# Patient Record
Sex: Female | Born: 1935 | Race: Black or African American | Hispanic: No | Marital: Single | Smoking: Current every day smoker
Health system: Southern US, Community
[De-identification: ages and names within clinical notes are randomized; demographics above are authoritative.]

## PROBLEM LIST (undated history)

## (undated) DIAGNOSIS — M5430 Sciatica, unspecified side: Secondary | ICD-10-CM

## (undated) DIAGNOSIS — M1990 Unspecified osteoarthritis, unspecified site: Secondary | ICD-10-CM

## (undated) DIAGNOSIS — I499 Cardiac arrhythmia, unspecified: Secondary | ICD-10-CM

## (undated) DIAGNOSIS — J40 Bronchitis, not specified as acute or chronic: Secondary | ICD-10-CM

## (undated) DIAGNOSIS — H9190 Unspecified hearing loss, unspecified ear: Secondary | ICD-10-CM

## (undated) DIAGNOSIS — F419 Anxiety disorder, unspecified: Secondary | ICD-10-CM

## (undated) DIAGNOSIS — R42 Dizziness and giddiness: Secondary | ICD-10-CM

## (undated) DIAGNOSIS — F32A Depression, unspecified: Secondary | ICD-10-CM

## (undated) DIAGNOSIS — J449 Chronic obstructive pulmonary disease, unspecified: Secondary | ICD-10-CM

## (undated) DIAGNOSIS — I10 Essential (primary) hypertension: Secondary | ICD-10-CM

## (undated) DIAGNOSIS — E785 Hyperlipidemia, unspecified: Secondary | ICD-10-CM

## (undated) DIAGNOSIS — F329 Major depressive disorder, single episode, unspecified: Secondary | ICD-10-CM

## (undated) DIAGNOSIS — J3089 Other allergic rhinitis: Secondary | ICD-10-CM

## (undated) HISTORY — DX: Dizziness and giddiness: R42

## (undated) HISTORY — PX: COLONOSCOPY: SHX174

## (undated) HISTORY — DX: Major depressive disorder, single episode, unspecified: F32.9

## (undated) HISTORY — DX: Anxiety disorder, unspecified: F41.9

## (undated) HISTORY — PX: NO PAST SURGERIES: SHX2092

## (undated) HISTORY — DX: Hyperlipidemia, unspecified: E78.5

## (undated) HISTORY — DX: Depression, unspecified: F32.A

---

## 2005-04-06 ENCOUNTER — Emergency Department: Payer: Self-pay | Admitting: Emergency Medicine

## 2005-04-10 ENCOUNTER — Ambulatory Visit: Payer: Self-pay | Admitting: Family Medicine

## 2005-05-02 ENCOUNTER — Encounter: Payer: Self-pay | Admitting: Unknown Physician Specialty

## 2005-05-26 ENCOUNTER — Encounter: Payer: Self-pay | Admitting: Unknown Physician Specialty

## 2007-05-01 ENCOUNTER — Ambulatory Visit: Payer: Self-pay

## 2007-05-07 ENCOUNTER — Ambulatory Visit: Payer: Self-pay

## 2009-04-23 ENCOUNTER — Ambulatory Visit: Payer: Self-pay | Admitting: Family Medicine

## 2010-05-02 ENCOUNTER — Emergency Department: Payer: Self-pay | Admitting: Emergency Medicine

## 2010-05-09 ENCOUNTER — Emergency Department: Payer: Self-pay | Admitting: Emergency Medicine

## 2012-12-29 ENCOUNTER — Emergency Department: Payer: Self-pay | Admitting: Emergency Medicine

## 2014-11-05 ENCOUNTER — Ambulatory Visit: Payer: Self-pay | Admitting: Internal Medicine

## 2014-11-23 ENCOUNTER — Ambulatory Visit: Payer: Self-pay | Admitting: Internal Medicine

## 2015-04-28 ENCOUNTER — Other Ambulatory Visit: Payer: Self-pay | Admitting: Internal Medicine

## 2015-04-28 DIAGNOSIS — N6489 Other specified disorders of breast: Secondary | ICD-10-CM

## 2015-05-19 ENCOUNTER — Other Ambulatory Visit: Payer: Self-pay

## 2015-10-21 IMAGING — US US BREAST*L* LIMITED INC AXILLA
1 series · 4 of 4 positions shown · non-contrast
Comparison: [DATE] [DATE], [DATE], [DATE] [DATE], [DATE]

CLINICAL DATA: Callback from screening mammogram for possible
masses left breast

EXAM:
DIGITAL DIAGNOSTIC LEFT MAMMOGRAM WITH CAD
DIGITAL BREAST TOMOSYNTHESIS
Digital breast tomosynthesis images are acquired in two projections.
These images are reviewed in combination with the digital mammogram,
confirming the findings below.
ULTRASOUND LEFT BREAST

[Series 1: us breast*left* limited inc axilla · 0.08mm/px · 4 of 4 slices shown]
[im 1/4]
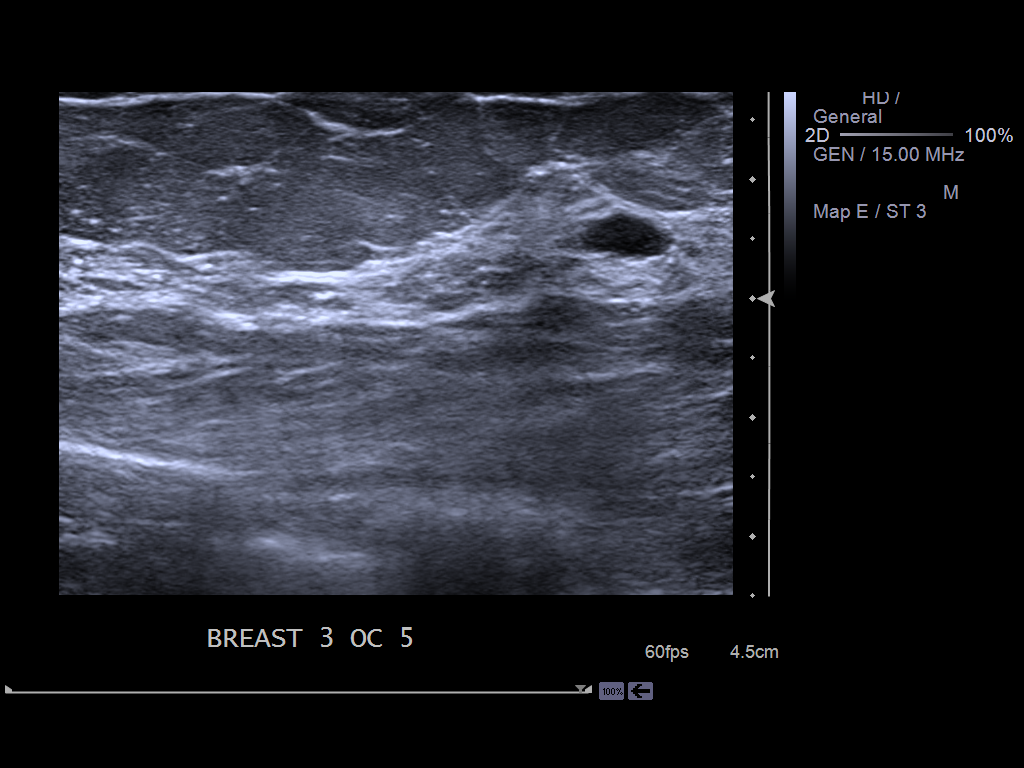
[im 2/4]
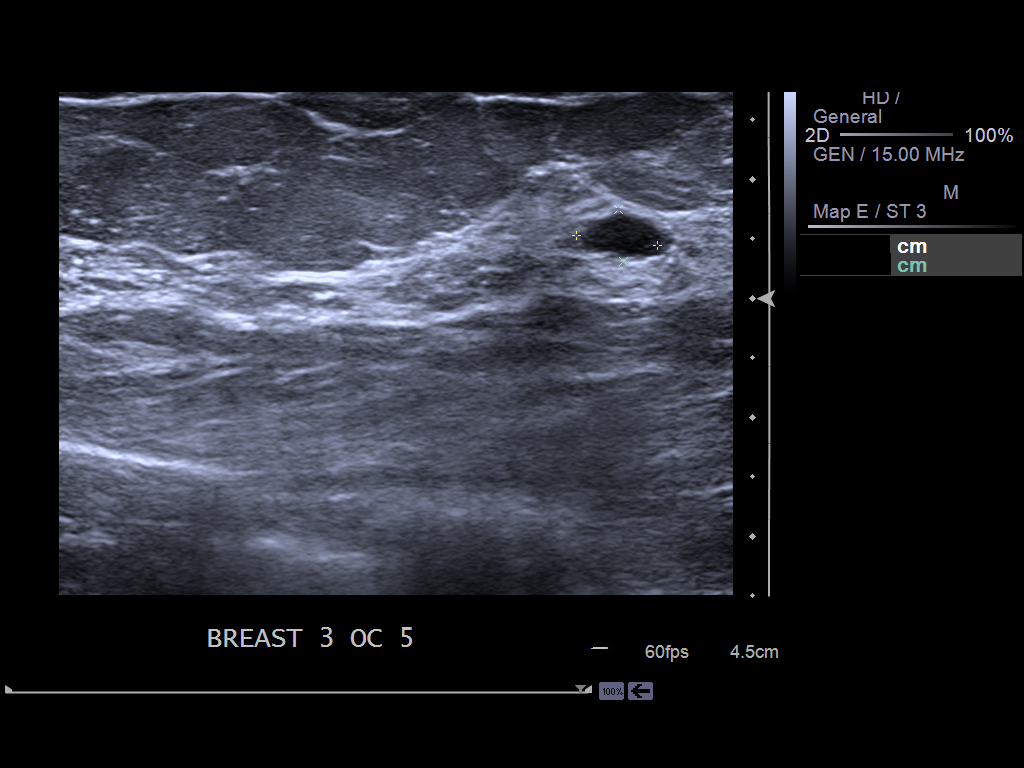
[im 3/4]
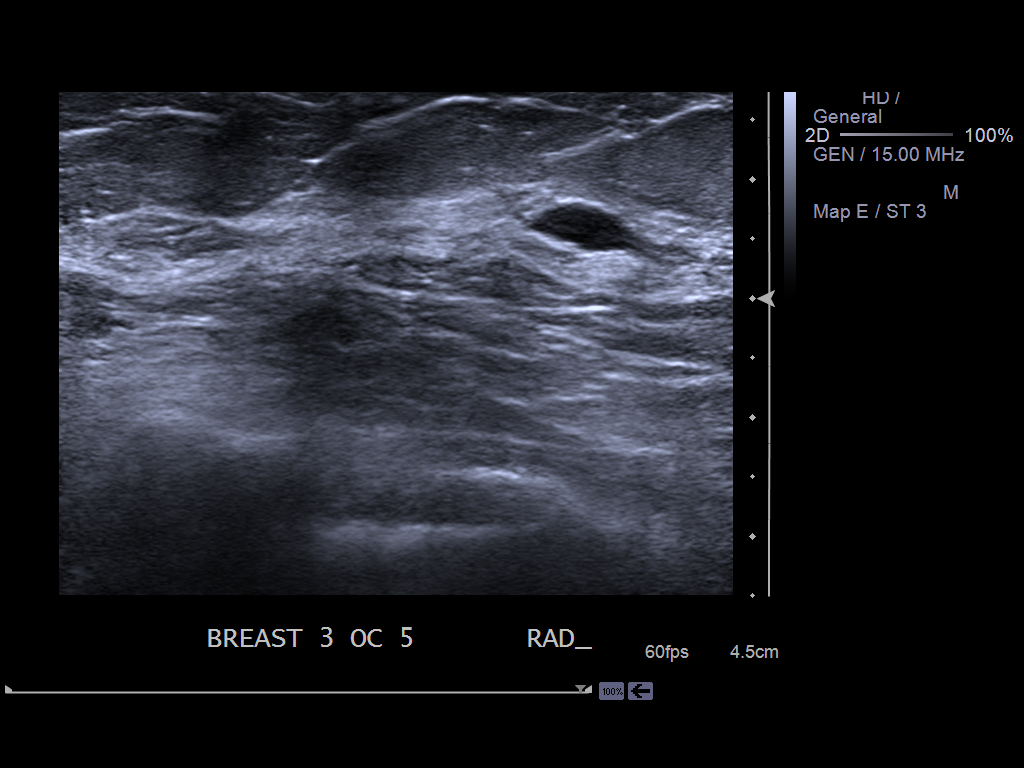
[im 4/4]
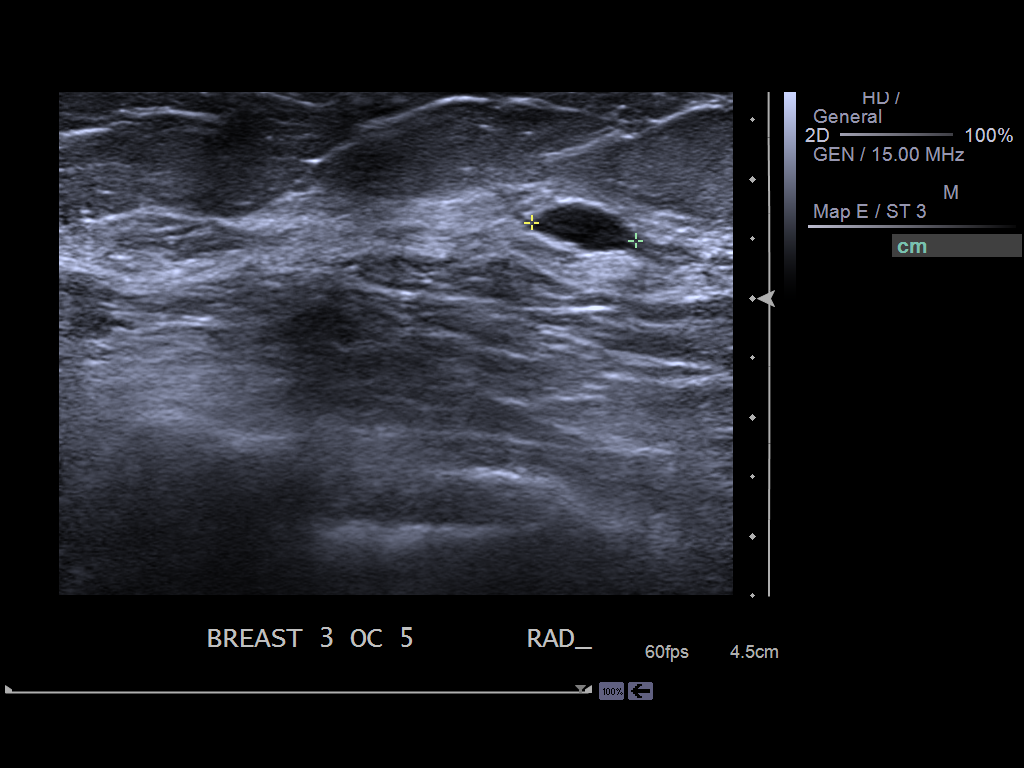

[4 of 4 positions shown; findings below may reference images not displayed]

ACR Breast Density Category b: There are scattered areas of
fibroglandular density.
FINDINGS: Tomographic spot compression CC and MLO views of the left breast are
submitted. The previously questioned asymmetry in the posterior
medial left breast persists on additional views.

Mammographic images were processed with CAD.

Targeted ultrasound is performed, showing a 7 x 4.4 x 9 mm simple
cyst in the left breast 3 o'clock 5 cm from nipple correlating to
the lateral lesion seen on the mammogram. Ultrasound of the entire
medial breast demonstrated no focal discrete abnormal cystic or
solid lesion that would correlate to the mammographic finding.
IMPRESSION: Probable benign findings.

RECOMMENDATION:
Six month followup mammogram left breast for medial asymmetry.

I have discussed the findings and recommendations with the patient.
Results were also provided in writing at the conclusion of the
visit. If applicable, a reminder letter will be sent to the patient
regarding the next appointment.

BI-RADS CATEGORY  3: Probably benign.

## 2016-08-03 ENCOUNTER — Other Ambulatory Visit: Payer: Self-pay | Admitting: Internal Medicine

## 2016-08-03 DIAGNOSIS — R911 Solitary pulmonary nodule: Secondary | ICD-10-CM

## 2016-08-15 ENCOUNTER — Ambulatory Visit
Admission: RE | Admit: 2016-08-15 | Discharge: 2016-08-15 | Disposition: A | Discharge status: Home / Self Care | Payer: Medicare PPO | Source: Ambulatory Visit | Attending: Internal Medicine | Admitting: Internal Medicine

## 2016-08-15 DIAGNOSIS — R590 Localized enlarged lymph nodes: Secondary | ICD-10-CM | POA: Diagnosis not present

## 2016-08-15 DIAGNOSIS — I700 Atherosclerosis of aorta: Secondary | ICD-10-CM | POA: Diagnosis not present

## 2016-08-15 DIAGNOSIS — R911 Solitary pulmonary nodule: Secondary | ICD-10-CM

## 2016-08-15 HISTORY — DX: Essential (primary) hypertension: I10

## 2016-08-15 MED ORDER — IOPAMIDOL (ISOVUE-300) INJECTION 61%
75.0000 mL | Freq: Once | INTRAVENOUS | Status: AC | PRN
  Administered 2016-08-15: 75 mL via INTRAVENOUS

## 2016-08-27 NOTE — Progress Notes (Signed)
[facility]-  Cancer Center  Clinic day:  08/28/2016  Chief Complaint: Monica Jordan is a 80 y.o. female with an abnormal chest CT who is referred in consultation by Dr. Marcello Fennel for assessment and management.  HPI:  The patient smoked one half a pack a day for 30 years. She was seen Dr. Marcello Fennel on 08/15/2016 for follow-up and a Medicare wellness visit. She described tingling in her hands and hands, feet and back for one week. She was unsteady on her feet with a tendency to lean to the left.  Balance had been poor.  She noted shortness of breath and was on a Z-Pak.  Labs and imaging studies (CXR, head MRI, and carotid ultrasound) were ordered.  She was encouraged to quit smoking.  Patient wanted to wait on her mammogram.  CBC on 08/02/2016 included a hematocrit of 43.4, hemoglobin 14.4, MCV 84.4, platelets 276,000, white count 11,700.  Comprehensive metabolic panel revealed a creatinine of 1.1.  TSH on 1.855 (normal).  Chest CT on 08/15/2016 revealed a 3.2 x 2.0 spiculated density in the right upper lobe concerning for malignancy. There were mildly enlarged 8 mm precarinal lymph nodes area.  There was a 5 mm nodule or intrapulmonary lymph node in the right upper lobe just above the right minor fissure.  PET scan was recommended for further evaluation.  Symptomatically, the patient notes decreased hearing in her left ear for the past month. She feels like it is "stopped up". She is dizzy. She denies any nausea or vomiting. She has "tingling all over", like a stinging sensation.  Colonoscopy in 05/09/2004 revealed hyperplastic polyps.  EGD was on 05/09/2004.  Mammogram on 11/23/2014 revealed a questionable asymmetry in the posterior medial left breast.  Ultrasound revealed a 7 x 4.4 x 9 mm simple cyst in the left breast to the lateral lesion seen on mammogram. Six month follow-up was recommended.   Past Medical History:  Diagnosis Date  . Dizziness    1 month  . Hypertension      History reviewed. No pertinent surgical history.  Family History  Problem Relation Age of Onset  . Breast cancer Sister   . Prostate cancer Brother   . Lung cancer Brother     Social History:  reports that she has been smoking.  She has a 15.00 pack-year smoking history. She has never used smokeless tobacco. She reports that she does not drink alcohol or use drugs.  The patient has smoked < 1 pack per day x 50 years.  She denies any exposure to radiation or toxins.  Martie Lee Balbi, daughter, is her medcial power of attorney.  The patient is alone today.  Allergies: No Known Allergies  Current Medications: Current Outpatient Prescriptions  Medication Sig Dispense Refill  . ALPRAZolam (XANAX) 0.25 MG tablet Take 0.25 mg by mouth at bedtime as needed for anxiety.    Marland Kitchen amLODipine (NORVASC) 5 MG tablet Take 5 mg by mouth daily.    . citalopram (CELEXA) 10 MG tablet Take 10 mg by mouth daily.    Marland Kitchen loratadine (CLARITIN) 10 MG tablet Take 10 mg by mouth daily.    . meclizine (ANTIVERT) 12.5 MG tablet Take 12.5 mg by mouth 3 (three) times daily as needed for dizziness.    . triamterene-hydrochlorothiazide (MAXZIDE-25) 37.5-25 MG tablet Take 1 tablet by mouth daily.     No current facility-administered medications for this visit.     Review of Systems:  GENERAL:  Fatigue.  No fevers, sweats  or weight loss. PERFORMANCE STATUS (ECOG):  1 HEENT:  Sinus drainage. No visual changes, sore throat, mouth sores or tenderness. Lungs: No shortness of breath.  Rare cough.  No hemoptysis. Cardiac:  No chest pain, palpitations, orthopnea, or PND. GI:  No nausea, vomiting, diarrhea, constipation, melena or hematochezia. GU:  No urgency, frequency, dysuria, or hematuria.  UTI 2-3 months ago. Musculoskeletal:  No back pain.  Arthritis.  No muscle tenderness. Extremities:  No pain or swelling. Skin:  No rashes or skin changes. Neuro:  Dizzy.  Decreased hearing left ear x 1 month.  No headache,  numbness or weakness, balance or coordination issues. Endocrine:  No diabetes, thyroid issues, hot flashes or night sweats. Psych:  No mood changes, depression or anxiety. Pain:  No focal pain. Review of systems:  All other systems reviewed and found to be negative.  Physical Exam: Blood pressure (!) 153/73, pulse 67, temperature 97.4 F (36.3 C), temperature source Tympanic, resp. rate 18, height 5\' 2"  (1.575 m), weight 170 lb 6.7 oz (77.3 kg). GENERAL:  Well developed, well nourished, woman sitting comfortably in the exam room in no acute distress. MENTAL STATUS:  Alert and oriented to person, place and time. HEAD:  Short gray hair.  Normocephalic, atraumatic, face symmetric, no Cushingoid features. EYES:  Glasses.  Brown eyes.  Pupils equal round and reactive to light and accomodation.  No conjunctivitis or scleral icterus. ENT:  Oropharynx clear without lesion.  Tongue normal.  Dentures.  Mucous membranes moist.  RESPIRATORY:  Clear to auscultation without rales, wheezes or rhonchi. CARDIOVASCULAR:  Regular rate and rhythm without murmur, rub or gallop. ABDOMEN:  Soft, non-tender, with active bowel sounds, and no hepatosplenomegaly.  No masses. SKIN:  No rashes, ulcers or lesions. EXTREMITIES: No edema, no skin discoloration or tenderness.  No palpable cords. LYMPH NODES: No palpable cervical, supraclavicular, axillary or inguinal adenopathy  NEUROLOGICAL: Alert & oriented, cranial nerves II-XII intact except for decreased hearing left ear; motor strength 5/5 throughout; sensation intact; finger to nose performed with some difficulty.  RAM normal; unable to walk heel to toe; negative Rhomberg; no clonus or Babinski.  PSYCH:  Appropriate.   No visits with results within 3 Day(s) from this visit.  Latest known visit with results is:  No results found for any previous visit.    Assessment:  Monica Jordan is a 80 y.o. female with 15 pack year smoking history and a new right upper lobe  mass.    Chest CT on 08/15/2016 revealed a 3.2 x 2.0 spiculated density in the right upper lobe concerning for malignancy. There were mildly enlarged 8 mm precarinal lymph nodes area.  There was a 5 mm nodule or intrapulmonary lymph node in the right upper lobe just above the right minor fissure.  Colonoscopy in 05/09/2004 revealed hyperplastic polyps.  EGD was on 05/09/2004.  Mammogram on 11/23/2014 revealed a questionable asymmetry in the posterior medial left breast.  Ultrasound revealed a 7 x 4.4 x 9 mm simple cyst in the left breast to the lateral lesion seen on mammogram. Six month follow-up was recommended.  Symptomatically, she has been dizzy.  She has decreased hearing in her left ear x 1 month.  Exam reveals slight difficulty with finger to nose and heel to toe walk.  Plan: 1.  Review scans with patient.  Discuss concern for lung cancer.  Discuss need for biopsy for confirmation of disease. Discuss staging PET scan to assess extent of disease and biopsy site.  Discuss management  of lung cancer in general terms.  Discuss head MRI given symptoms of dizziness, decreased hearing in left ear and neurologic exam.  Multiple questions were asked and answered. 2.  PET scan. 3.  Head MRI. 4.  RTC after above for MD assessment.   Rosey BathMelissa C Corcoran, MD  08/28/2016, 4:07 PM

## 2016-08-28 ENCOUNTER — Encounter: Payer: Self-pay | Admitting: Hematology and Oncology

## 2016-08-28 ENCOUNTER — Inpatient Hospital Stay: Payer: Medicare PPO | Attending: Hematology and Oncology | Admitting: Hematology and Oncology

## 2016-08-28 VITALS — BP 153/73 | HR 67 | Temp 97.4°F | Resp 18 | Ht 62.0 in | Wt 170.4 lb

## 2016-08-28 DIAGNOSIS — Z79899 Other long term (current) drug therapy: Secondary | ICD-10-CM | POA: Insufficient documentation

## 2016-08-28 DIAGNOSIS — H9192 Unspecified hearing loss, left ear: Secondary | ICD-10-CM | POA: Diagnosis not present

## 2016-08-28 DIAGNOSIS — I10 Essential (primary) hypertension: Secondary | ICD-10-CM | POA: Insufficient documentation

## 2016-08-28 DIAGNOSIS — R911 Solitary pulmonary nodule: Secondary | ICD-10-CM

## 2016-08-28 DIAGNOSIS — R928 Other abnormal and inconclusive findings on diagnostic imaging of breast: Secondary | ICD-10-CM | POA: Insufficient documentation

## 2016-08-28 DIAGNOSIS — R42 Dizziness and giddiness: Secondary | ICD-10-CM | POA: Insufficient documentation

## 2016-08-28 DIAGNOSIS — F17210 Nicotine dependence, cigarettes, uncomplicated: Secondary | ICD-10-CM | POA: Diagnosis not present

## 2016-08-28 NOTE — Progress Notes (Signed)
Pt had dizziness for about 1 month and some drainage and she saw PCP and he gave her meclizine and had chest xray that was abnl and then had ct scan and then it was abnl and she was sent here.  She stopped taking the meclizine because it is not working for her but to keep on list

## 2016-09-07 ENCOUNTER — Ambulatory Visit: Payer: Medicare PPO

## 2016-09-08 ENCOUNTER — Inpatient Hospital Stay: Payer: Medicare PPO | Admitting: Hematology and Oncology

## 2016-09-20 ENCOUNTER — Ambulatory Visit
Admission: RE | Admit: 2016-09-20 | Discharge: 2016-09-20 | Disposition: A | Discharge status: Home / Self Care | Payer: Medicare PPO | Source: Ambulatory Visit | Attending: Hematology and Oncology | Admitting: Hematology and Oncology

## 2016-09-20 ENCOUNTER — Ambulatory Visit: Payer: Medicare PPO

## 2016-09-20 DIAGNOSIS — R918 Other nonspecific abnormal finding of lung field: Secondary | ICD-10-CM | POA: Diagnosis not present

## 2016-09-20 DIAGNOSIS — R911 Solitary pulmonary nodule: Secondary | ICD-10-CM | POA: Insufficient documentation

## 2016-09-20 DIAGNOSIS — Z79899 Other long term (current) drug therapy: Secondary | ICD-10-CM | POA: Insufficient documentation

## 2016-09-20 LAB — GLUCOSE, CAPILLARY: Glucose-Capillary: 87 mg/dL (ref 65–99)

## 2016-09-20 MED ORDER — FLUDEOXYGLUCOSE F - 18 (FDG) INJECTION
11.7200 | Freq: Once | INTRAVENOUS | Status: AC | PRN
  Administered 2016-09-20: 11.72 via INTRAVENOUS

## 2016-09-22 ENCOUNTER — Inpatient Hospital Stay (HOSPITAL_BASED_OUTPATIENT_CLINIC_OR_DEPARTMENT_OTHER): Payer: Medicare PPO | Admitting: Hematology and Oncology

## 2016-09-22 ENCOUNTER — Inpatient Hospital Stay: Payer: Medicare PPO

## 2016-09-22 VITALS — BP 151/75 | HR 71 | Resp 18 | Wt 168.2 lb

## 2016-09-22 DIAGNOSIS — R42 Dizziness and giddiness: Secondary | ICD-10-CM | POA: Diagnosis not present

## 2016-09-22 DIAGNOSIS — R911 Solitary pulmonary nodule: Secondary | ICD-10-CM

## 2016-09-22 DIAGNOSIS — F17210 Nicotine dependence, cigarettes, uncomplicated: Secondary | ICD-10-CM

## 2016-09-22 DIAGNOSIS — Z79899 Other long term (current) drug therapy: Secondary | ICD-10-CM

## 2016-09-22 DIAGNOSIS — R928 Other abnormal and inconclusive findings on diagnostic imaging of breast: Secondary | ICD-10-CM | POA: Diagnosis not present

## 2016-09-22 LAB — CBC WITH DIFFERENTIAL/PLATELET
Basophils Absolute: 0.1 10*3/uL (ref 0–0.1)
Basophils Relative: 1 %
Eosinophils Absolute: 0.4 10*3/uL (ref 0–0.7)
Eosinophils Relative: 4 %
HCT: 40 % (ref 35.0–47.0)
Hemoglobin: 13.5 g/dL (ref 12.0–16.0)
Lymphocytes Relative: 22 %
Lymphs Abs: 2.2 10*3/uL (ref 1.0–3.6)
MCH: 27.9 pg (ref 26.0–34.0)
MCHC: 33.6 g/dL (ref 32.0–36.0)
MCV: 82.9 fL (ref 80.0–100.0)
Monocytes Absolute: 0.7 10*3/uL (ref 0.2–0.9)
Monocytes Relative: 7 %
Neutro Abs: 6.8 10*3/uL — ABNORMAL HIGH (ref 1.4–6.5)
Neutrophils Relative %: 66 %
Platelets: 281 10*3/uL (ref 150–440)
RBC: 4.83 MIL/uL (ref 3.80–5.20)
RDW: 13.7 % (ref 11.5–14.5)
WBC: 10.2 10*3/uL (ref 3.6–11.0)

## 2016-09-22 LAB — COMPREHENSIVE METABOLIC PANEL
ALT: 17 U/L (ref 14–54)
AST: 19 U/L (ref 15–41)
Albumin: 4.1 g/dL (ref 3.5–5.0)
Alkaline Phosphatase: 69 U/L (ref 38–126)
Anion gap: 7 (ref 5–15)
BUN: 22 mg/dL — ABNORMAL HIGH (ref 6–20)
CO2: 24 mmol/L (ref 22–32)
Calcium: 9.2 mg/dL (ref 8.9–10.3)
Chloride: 105 mmol/L (ref 101–111)
Creatinine, Ser: 1.17 mg/dL — ABNORMAL HIGH (ref 0.44–1.00)
GFR calc Af Amer: 50 mL/min — ABNORMAL LOW (ref >60)
GFR calc non Af Amer: 43 mL/min — ABNORMAL LOW (ref >60)
Glucose, Bld: 86 mg/dL (ref 65–99)
Potassium: 4.2 mmol/L (ref 3.5–5.1)
Sodium: 136 mmol/L (ref 135–145)
Total Bilirubin: 0.4 mg/dL (ref 0.3–1.2)
Total Protein: 7.6 g/dL (ref 6.5–8.1)

## 2016-09-22 LAB — LACTATE DEHYDROGENASE: LDH: 114 U/L (ref 98–192)

## 2016-09-22 NOTE — Progress Notes (Signed)
[facility] day:  09/22/2016  Chief Complaint: Monica Jordan is a 80 y.o. female with an abnormal chest CT who is seen for review of work-up and discussion regarding direction of therapy.  HPI:  The patient was last seen in the medical oncology clinic on 08/28/2016.  At that time, she was seen for initial consultation.  She had a 3.2 cm RUL mass concerning for malignancy.  She had unexplained dizziness.  PET scan and head MRI were ordered.  PET scan on 09/20/2016 revealed a 3.2 cm hypermetabolic (SUV 7.5) spiculated mass in posterior right upper lobe, highly suspicious for primary bronchogenic carcinoma.  There was a 7 mm posterior left upper lobe pulmonary nodule with low-grade metabolic activity, new since previous study 1 month ago.  Rapid evolution suggested inflammatory or infectious etiology.  There was a 4 mm right lung pulmonary nodule with no associated FDG uptake, but too small to characterize by PET. Continued attention recommended on follow-up CT.  There was no evidence of thoracic nodal or distant metastatic disease.  The patient did not have a head MRI.  She states that the MRI machine was down.  MRI has been rescheduled on 09/29/2016.  Symptomatically, she notes a roaring sensation in her left ear since 06/2016.  She denies any numbness or weakness, balance or coordination issues.  She denies any shortness of breath.   Past Medical History:  Diagnosis Date  . Dizziness    1 month  . Hypertension     No past surgical history on file.  Family History  Problem Relation Age of Onset  . Breast cancer Sister   . Prostate cancer Brother   . Lung cancer Brother     Social History:  reports that she has been smoking.  She has a 15.00 pack-year smoking history. She has never used smokeless tobacco. She reports that she does not drink alcohol or use drugs.  The patient has smoked < 1 pack per day x 50 years.  She denies any exposure to  radiation or toxins.  Gabriel Cirri Barrientes, daughter, is her medcial power of attorney.  The patient is accompanied by her daughter, Gabriel Cirri, and grand-daughter, Cassell Smiles, today.  Allergies: No Known Allergies  Current Medications: Current Outpatient Prescriptions  Medication Sig Dispense Refill  . ALPRAZolam (XANAX) 0.25 MG tablet Take 0.25 mg by mouth at bedtime as needed for anxiety.    Marland Kitchen amLODipine (NORVASC) 5 MG tablet Take 5 mg by mouth daily.    . citalopram (CELEXA) 10 MG tablet Take 10 mg by mouth daily.    Marland Kitchen loratadine (CLARITIN) 10 MG tablet Take 10 mg by mouth daily.    . meclizine (ANTIVERT) 12.5 MG tablet Take 12.5 mg by mouth 3 (three) times daily as needed for dizziness.    . triamterene-hydrochlorothiazide (MAXZIDE-25) 37.5-25 MG tablet Take 1 tablet by mouth daily.     No current facility-administered medications for this visit.     Review of Systems:  GENERAL:  Fatigue.  No fevers, sweats or weight loss. PERFORMANCE STATUS (ECOG):  1 HEENT:  Roaring sensation in left ear since 06/2016.  No visual changes, sore throat, mouth sores or tenderness. Lungs: No shortness of breath.  Rare cough.  No hemoptysis. Cardiac:  No chest pain, palpitations, orthopnea, or PND. GI:  No nausea, vomiting, diarrhea, constipation, melena or hematochezia. GU:  No urgency, frequency, dysuria, or hematuria.  UTI 2-3 months ago. Musculoskeletal:  No back pain.  Arthritis.  No muscle tenderness. Extremities:  No pain or swelling. Skin:  No rashes or skin changes. Neuro:  Decreased hearing left ear x 2 months.  No headache, numbness or weakness, balance or coordination issues. Endocrine:  No diabetes, thyroid issues, hot flashes or night sweats. Psych:  No mood changes, depression or anxiety. Pain:  No focal pain. Review of systems:  All other systems reviewed and found to be negative.  Physical Exam: Blood pressure (!) 151/75, pulse 71, resp. rate 18, weight 168 lb 3.4 oz (76.3  kg). GENERAL:  Well developed, well nourished, woman sitting comfortably in the exam room in no acute distress. MENTAL STATUS:  Alert and oriented to person, place and time. HEAD:  Short gray hair.  Normocephalic, atraumatic, face symmetric, no Cushingoid features. EYES:  Glasses.  Brown eyes.  Pupils equal round and reactive to light and accomodation.  No conjunctivitis or scleral icterus. ENT:  Oropharynx clear without lesion.  Tongue normal.  Dentures.  Mucous membranes moist.  RESPIRATORY:  Clear to auscultation without rales, wheezes or rhonchi. CARDIOVASCULAR:  Regular rate and rhythm without murmur, rub or gallop. ABDOMEN:  Soft, non-tender, with active bowel sounds, and no hepatosplenomegaly.  No masses. SKIN:  No rashes, ulcers or lesions. EXTREMITIES: No edema, no skin discoloration or tenderness.  No palpable cords. LYMPH NODES: No palpable cervical, supraclavicular, axillary or inguinal adenopathy  NEUROLOGICAL: Alert & oriented, cranial nerves II-XII intact except for decreased hearing left ear; motor strength 5/5 throughout; sensation intact; finger to nose performed with some difficulty.  RAM normal; unable to walk heel to toe; negative Rhomberg; no clonus or Babinski.  PSYCH:  Appropriate.   Appointment on 09/22/2016  Component Date Value Ref Range Status  . WBC 09/22/2016 10.2  3.6 - 11.0 K/uL Final  . RBC 09/22/2016 4.83  3.80 - 5.20 MIL/uL Final  . Hemoglobin 09/22/2016 13.5  12.0 - 16.0 g/dL Final  . HCT 09/22/2016 40.0  35.0 - 47.0 % Final  . MCV 09/22/2016 82.9  80.0 - 100.0 fL Final  . MCH 09/22/2016 27.9  26.0 - 34.0 pg Final  . MCHC 09/22/2016 33.6  32.0 - 36.0 g/dL Final  . RDW 09/22/2016 13.7  11.5 - 14.5 % Final  . Platelets 09/22/2016 281  150 - 440 K/uL Final  . Neutrophils Relative % 09/22/2016 66  % Final  . Neutro Abs 09/22/2016 6.8* 1.4 - 6.5 K/uL Final  . Lymphocytes Relative 09/22/2016 22  % Final  . Lymphs Abs 09/22/2016 2.2  1.0 - 3.6 K/uL Final  .  Monocytes Relative 09/22/2016 7  % Final  . Monocytes Absolute 09/22/2016 0.7  0.2 - 0.9 K/uL Final  . Eosinophils Relative 09/22/2016 4  % Final  . Eosinophils Absolute 09/22/2016 0.4  0 - 0.7 K/uL Final  . Basophils Relative 09/22/2016 1  % Final  . Basophils Absolute 09/22/2016 0.1  0 - 0.1 K/uL Final  . Sodium 09/22/2016 136  135 - 145 mmol/L Final  . Potassium 09/22/2016 4.2  3.5 - 5.1 mmol/L Final  . Chloride 09/22/2016 105  101 - 111 mmol/L Final  . CO2 09/22/2016 24  22 - 32 mmol/L Final  . Glucose, Bld 09/22/2016 86  65 - 99 mg/dL Final  . BUN 09/22/2016 22* 6 - 20 mg/dL Final  . Creatinine, Ser 09/22/2016 1.17* 0.44 - 1.00 mg/dL Final  . Calcium 09/22/2016 9.2  8.9 - 10.3 mg/dL Final  . Total Protein 09/22/2016 7.6  6.5 - 8.1 g/dL Final  .  Albumin 09/22/2016 4.1  3.5 - 5.0 g/dL Final  . AST 09/22/2016 19  15 - 41 U/L Final  . ALT 09/22/2016 17  14 - 54 U/L Final  . Alkaline Phosphatase 09/22/2016 69  38 - 126 U/L Final  . Total Bilirubin 09/22/2016 0.4  0.3 - 1.2 mg/dL Final  . GFR calc non Af Amer 09/22/2016 43* >60 mL/min Final  . GFR calc Af Amer 09/22/2016 50* >60 mL/min Final   Comment: (NOTE) The eGFR has been calculated using the CKD EPI equation. This calculation has not been validated in all clinical situations. eGFR's persistently <60 mL/min signify possible Chronic Kidney Disease.   . Anion gap 09/22/2016 7  5 - 15 Final  . CEA 09/23/2016 3.8  0.0 - 4.7 ng/mL Final   Comment: (NOTE)       Roche ECLIA methodology       Nonsmokers  <3.9                                     Smokers     <5.6 Performed At: [facility] [place], [place] [id number] Lindon Romp MD [place]:[id number]   . LDH 09/22/2016 114  98 - 192 U/L Final  Hospital Outpatient Visit on 09/20/2016  Component Date Value Ref Range Status  . Glucose-Capillary 09/20/2016 87  65 - 99 mg/dL Final    Assessment:  Oreoluwa Gilmer is a 80 y.o. female with a clinical stage IB right  upper lobe mass.  She has a 15 pack year smoking history.  Chest CT on 08/15/2016 revealed a 3.2 x 2.0 spiculated density in the right upper lobe concerning for malignancy. There were mildly enlarged 8 mm precarinal lymph nodes area.  There was a 5 mm nodule or intrapulmonary lymph node in the right upper lobe just above the right minor fissure.  PET scan on 09/20/2016 revealed a 3.2 cm hypermetabolic (SUV 7.5) spiculated mass in posterior right upper lobe, highly suspicious for primary bronchogenic carcinoma.  There was a 7 mm posterior left upper lobe pulmonary nodule with low-grade metabolic activity, new since previous study 1 month ago.  Rapid evolution suggested inflammatory or infectious etiology.  There was a 4 mm right lung pulmonary nodule with no associated FDG uptake.  Colonoscopy in 05/09/2004 revealed hyperplastic polyps.  EGD was on 05/09/2004.  Mammogram on 11/23/2014 revealed a questionable asymmetry in the posterior medial left breast.  Ultrasound revealed a 7 x 4.4 x 9 mm simple cyst in the left breast to the lateral lesion seen on mammogram. Six month follow-up was recommended.  Symptomatically, she has been dizzy.  She has decreased hearing in her left ear x 2 months.  Exam reveals slight difficulty with finger to nose and heel to toe walk.  Plan: 1.  Review PET scan images with patient.  PET scan reveals a T2a lesion.  No apparent lymph nodes involved.  Significance of 7 mm LUL nodule unclear (possibly inflammatory as rapid evolution; too small to biopsy) and 4 mm RUL nodule unclear.  No evidence of hypermetabolic mediastinal lymph nodes.  Patient may be a surgical candidate if PFTs adequate.  Discuss potential CT guided biopsy given peripheral lesion.  Discuss consult with cardiothoracic surgery. 2.  Labs today:  CBC with diff, CMP, CEA, LDH. 3.  Schedule pulmonary function tests. 4.  Consult Dr. Genevive Bi 5.  Tumor board 09/28/2016 6.  Head MRI  on 09/29/2016 (rescheduled) 7.  RTC  in 1 week for discussion regarding direction of therapy.   Lequita Asal, MD  09/22/2016

## 2016-09-22 NOTE — Progress Notes (Signed)
Patient here today for PET results. Offers no complaints.  BP elevated.  163/90  HR 64   Recehck 151/75 HR 71.

## 2016-09-23 LAB — CEA: CEA: 3.8 ng/mL (ref 0.0–4.7)

## 2016-09-26 ENCOUNTER — Other Ambulatory Visit: Payer: Self-pay | Admitting: *Deleted

## 2016-09-28 ENCOUNTER — Encounter: Payer: Self-pay | Admitting: Hematology and Oncology

## 2016-09-28 ENCOUNTER — Ambulatory Visit: Payer: Medicare PPO

## 2016-09-28 ENCOUNTER — Inpatient Hospital Stay: Payer: Medicare PPO | Attending: Hematology and Oncology

## 2016-09-28 DIAGNOSIS — Z87891 Personal history of nicotine dependence: Secondary | ICD-10-CM | POA: Insufficient documentation

## 2016-09-28 DIAGNOSIS — F419 Anxiety disorder, unspecified: Secondary | ICD-10-CM | POA: Insufficient documentation

## 2016-09-28 DIAGNOSIS — H7012 Chronic mastoiditis, left ear: Secondary | ICD-10-CM | POA: Insufficient documentation

## 2016-09-28 DIAGNOSIS — Z79899 Other long term (current) drug therapy: Secondary | ICD-10-CM | POA: Insufficient documentation

## 2016-09-28 DIAGNOSIS — I10 Essential (primary) hypertension: Secondary | ICD-10-CM | POA: Insufficient documentation

## 2016-09-28 DIAGNOSIS — E785 Hyperlipidemia, unspecified: Secondary | ICD-10-CM | POA: Insufficient documentation

## 2016-09-28 DIAGNOSIS — M1990 Unspecified osteoarthritis, unspecified site: Secondary | ICD-10-CM | POA: Insufficient documentation

## 2016-09-28 DIAGNOSIS — R911 Solitary pulmonary nodule: Secondary | ICD-10-CM | POA: Insufficient documentation

## 2016-09-29 ENCOUNTER — Telehealth: Payer: Self-pay

## 2016-09-29 ENCOUNTER — Ambulatory Visit (INDEPENDENT_AMBULATORY_CARE_PROVIDER_SITE_OTHER): Payer: Medicare PPO | Admitting: Cardiothoracic Surgery

## 2016-09-29 ENCOUNTER — Encounter: Payer: Self-pay | Admitting: Cardiothoracic Surgery

## 2016-09-29 ENCOUNTER — Ambulatory Visit
Admission: RE | Admit: 2016-09-29 | Discharge: 2016-09-29 | Disposition: A | Discharge status: Home / Self Care | Payer: Medicare PPO | Source: Ambulatory Visit | Attending: Hematology and Oncology | Admitting: Hematology and Oncology

## 2016-09-29 VITALS — BP 152/79 | HR 89 | Temp 98.6°F | Ht 62.0 in | Wt 173.0 lb

## 2016-09-29 DIAGNOSIS — H9192 Unspecified hearing loss, left ear: Secondary | ICD-10-CM | POA: Diagnosis present

## 2016-09-29 DIAGNOSIS — R918 Other nonspecific abnormal finding of lung field: Secondary | ICD-10-CM

## 2016-09-29 DIAGNOSIS — H7012 Chronic mastoiditis, left ear: Secondary | ICD-10-CM | POA: Insufficient documentation

## 2016-09-29 DIAGNOSIS — R42 Dizziness and giddiness: Secondary | ICD-10-CM | POA: Diagnosis not present

## 2016-09-29 DIAGNOSIS — F329 Major depressive disorder, single episode, unspecified: Secondary | ICD-10-CM | POA: Insufficient documentation

## 2016-09-29 DIAGNOSIS — R9082 White matter disease, unspecified: Secondary | ICD-10-CM | POA: Diagnosis not present

## 2016-09-29 DIAGNOSIS — F17200 Nicotine dependence, unspecified, uncomplicated: Secondary | ICD-10-CM | POA: Insufficient documentation

## 2016-09-29 DIAGNOSIS — N289 Disorder of kidney and ureter, unspecified: Secondary | ICD-10-CM | POA: Insufficient documentation

## 2016-09-29 DIAGNOSIS — F419 Anxiety disorder, unspecified: Secondary | ICD-10-CM | POA: Insufficient documentation

## 2016-09-29 DIAGNOSIS — M2548 Effusion, other site: Secondary | ICD-10-CM | POA: Insufficient documentation

## 2016-09-29 DIAGNOSIS — F32A Depression, unspecified: Secondary | ICD-10-CM | POA: Insufficient documentation

## 2016-09-29 DIAGNOSIS — J309 Allergic rhinitis, unspecified: Secondary | ICD-10-CM | POA: Insufficient documentation

## 2016-09-29 DIAGNOSIS — I10 Essential (primary) hypertension: Secondary | ICD-10-CM | POA: Insufficient documentation

## 2016-09-29 MED ORDER — GADOBENATE DIMEGLUMINE 529 MG/ML IV SOLN
15.0000 mL | Freq: Once | INTRAVENOUS | Status: AC | PRN
  Administered 2016-09-29: 15 mL via INTRAVENOUS

## 2016-09-29 NOTE — Patient Instructions (Signed)
Please stop taking your Aspirin in order for us to schedule your CT Guided Lung Biopsy.  We will contact you once we have your CT Guided Lung Biopsy scheduled.  Please try to stop smoking from now for us to prepare you for your surgery. If you want to stop smoking with some help. Please let us know so we could get Shawn (nurse) to contact you to give you the tools to help you quit.

## 2016-09-29 NOTE — Progress Notes (Signed)
Patient ID: Monica Jordan, female   DOB: 1935/10/02, 81 y.o.   MRN: [id number]  Chief Complaint  Patient presents with  . Other    RUL and LUL Nodules      Referred By Dr. Merlene Pulling Reason for Referral right upper lobe mass  HPI Location, Quality, Duration, Severity, Timing, Context, Modifying Factors, Associated Signs and Symptoms.  Monica Jordan is a 81 y.o. female.  She was in her usual state of health until she had her annual physical exam performed. As part of her physical exam she had a chest x-ray made. The chest x-ray showed a right upper lobe mass. This was followed by a CT scan revealing a spiculated right upper lobe mass without associated mediastinal adenopathy. The patient was then seen by Dr. Merlene Pulling where a PET scan was performed. The PET scan revealed intense uptake in the right upper lobe mass without any evidence of distant disease. The patient was seen by Dr. Merlene Pulling and she complained at that time of some dizziness and left-sided ear pain. She is scheduled to have an MRI of her brain and her ear later today. She scheduled to have pulmonary function studies performed a week. She will follow-up with Dr. Merlene Pulling and myself next week as well.  The patient's a lifelong smoker. She smokes half a pack cigarettes a day. She also has a brother with lung cancer. She has no known right on or asbestos exposure.   Past Medical History:  Diagnosis Date  . Anxiety   . Depression   . Dizziness    1 month  . Hyperlipidemia   . Hypertension     Past Surgical History:  Procedure Laterality Date  . NO PAST SURGERIES      Family History  Problem Relation Age of Onset  . Breast cancer Sister   . Prostate cancer Brother   . Lung cancer Brother   . Healthy Mother   . Healthy Father   . Lung cancer Brother     Social History Social History  Substance Use Topics  . Smoking status: Current Every Day Smoker    Packs/day: 0.50    Years: 30.00  . Smokeless tobacco: Never  Used  . Alcohol use No    No Known Allergies  Current Outpatient Prescriptions  Medication Sig Dispense Refill  . ALPRAZolam (XANAX) 0.25 MG tablet Take 0.25 mg by mouth at bedtime as needed for anxiety.    Marland Kitchen amLODipine (NORVASC) 5 MG tablet Take 5 mg by mouth daily.    Marland Kitchen aspirin EC 81 MG tablet Take 1 tablet by mouth 1 day or 1 dose.    Marland Kitchen atorvastatin (LIPITOR) 20 MG tablet Take 1 tablet by mouth 1 day or 1 dose.    . citalopram (CELEXA) 10 MG tablet Take 10 mg by mouth daily.    Marland Kitchen triamterene-hydrochlorothiazide (MAXZIDE-25) 37.5-25 MG tablet Take 1 tablet by mouth daily.     No current facility-administered medications for this visit.       Review of Systems A complete review of systems was asked and was negative except for the following positive findings Frequent cough depression shortness of breath with exertion  Blood pressure (!) 152/79, pulse 89, temperature 98.6 F (37 C), temperature source Oral, height 5\' 2"  (1.575 m), weight 173 lb (78.5 kg), SpO2 98 %.  Physical Exam CONSTITUTIONAL:  Pleasant, well-developed, well-nourished, and in no acute distress. EYES: Pupils equal and reactive to light, Sclera non-icteric EARS, NOSE, MOUTH AND THROAT:  The oropharynx was  clear.  Dentition is good repair dentures on the top.  Oral mucosa pink and moist. LYMPH NODES:  Lymph nodes in the neck and axillae were normal RESPIRATORY:  Lungs were clear.  Normal respiratory effort without pathologic use of accessory muscles of respiration CARDIOVASCULAR: Heart was regular without murmurs.  There were no carotid bruits. GI: The abdomen was soft, nontender, and nondistended. There were no palpable masses. There was no hepatosplenomegaly. There were normal bowel sounds in all quadrants. GU:  Rectal deferred.   MUSCULOSKELETAL:  Normal muscle strength and tone.  No clubbing or cyanosis.   SKIN:  There were no pathologic skin lesions.  There were no nodules on palpation. NEUROLOGIC:  Sensation  is normal.  Cranial nerves are grossly intact. PSYCH:  Oriented to person, place and time.  Mood and affect are normal.  Data Reviewed CT scan and PET scan  I have personally reviewed the patient's imaging, laboratory findings and medical records.    Assessment    I have independently reviewed the CT scan and PET scan. There is a spiculated right upper lobe mass without mediastinal adenopathy or distant disease. 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    Plan    I had a long discussion with her and her daughter regarding the options. We discussed the role of surgical resection for a presumed stage I carcinoma the lung. The patient would like to undergo a CT-guided needle biopsy in preparation for any upcoming surgery and will go ahead and set that up. I counseled her on tobacco cessation and also asked her to stop her aspirin. She will follow-up with Dr. Merlene Pullingorcoran once her MRIs complete and she'll follow-up with me next week.      Hulda Marinimothy Qianna Clagett, MD 09/29/2016, 9:05 AM

## 2016-09-29 NOTE — Telephone Encounter (Signed)
[facility] Awaiting for her to call me with an appointment once she gives

## 2016-09-29 NOTE — Telephone Encounter (Signed)
Awaiting on [facility] to call me back with an appointment.

## 2016-10-02 ENCOUNTER — Ambulatory Visit: Payer: Medicare PPO | Admitting: Hematology and Oncology

## 2016-10-02 ENCOUNTER — Other Ambulatory Visit: Payer: Self-pay

## 2016-10-02 ENCOUNTER — Other Ambulatory Visit: Payer: Self-pay | Admitting: Cardiothoracic Surgery

## 2016-10-02 DIAGNOSIS — R911 Solitary pulmonary nodule: Secondary | ICD-10-CM

## 2016-10-02 NOTE — Telephone Encounter (Signed)
Called patient to let her know that her CT Guided Lung Biopsy on 10/05/2016 at [facility] Told patient that a nurse will be calling her to go over her medical history and medications. Patient was informed not to eat or drink after midnight.

## 2016-10-02 NOTE — Progress Notes (Signed)
Britta MccreedyBarbara from [facility] called me to let me know that patient will have her CT Guided Biopsy done on 10/05/2016 at 8:00 [facility]. A nurse will be calling patient to go over her medical history. Patient needs to be NPO after midnight. I will contact patient to notify her.

## 2016-10-03 ENCOUNTER — Other Ambulatory Visit: Payer: Self-pay | Admitting: Radiology

## 2016-10-03 ENCOUNTER — Ambulatory Visit: Payer: Medicare PPO | Attending: Hematology and Oncology

## 2016-10-03 DIAGNOSIS — R0609 Other forms of dyspnea: Secondary | ICD-10-CM | POA: Insufficient documentation

## 2016-10-03 DIAGNOSIS — F17210 Nicotine dependence, cigarettes, uncomplicated: Secondary | ICD-10-CM | POA: Diagnosis not present

## 2016-10-03 DIAGNOSIS — R911 Solitary pulmonary nodule: Secondary | ICD-10-CM | POA: Diagnosis not present

## 2016-10-05 ENCOUNTER — Inpatient Hospital Stay (HOSPITAL_BASED_OUTPATIENT_CLINIC_OR_DEPARTMENT_OTHER): Payer: Medicare PPO | Admitting: Hematology and Oncology

## 2016-10-05 ENCOUNTER — Ambulatory Visit
Admission: RE | Admit: 2016-10-05 | Discharge: 2016-10-05 | Disposition: A | Discharge status: Home / Self Care | Payer: Medicare PPO | Source: Ambulatory Visit | Attending: Cardiothoracic Surgery | Admitting: Cardiothoracic Surgery

## 2016-10-05 ENCOUNTER — Ambulatory Visit
Admission: RE | Admit: 2016-10-05 | Discharge: 2016-10-05 | Disposition: A | Discharge status: Home / Self Care | Payer: Medicare PPO | Source: Ambulatory Visit | Attending: Interventional Radiology | Admitting: Interventional Radiology

## 2016-10-05 ENCOUNTER — Encounter: Payer: Self-pay | Admitting: Hematology and Oncology

## 2016-10-05 VITALS — BP 144/89 | HR 76 | Temp 96.5°F | Resp 18 | Wt 170.0 lb

## 2016-10-05 DIAGNOSIS — Z9889 Other specified postprocedural states: Secondary | ICD-10-CM | POA: Diagnosis present

## 2016-10-05 DIAGNOSIS — J8410 Pulmonary fibrosis, unspecified: Secondary | ICD-10-CM | POA: Diagnosis not present

## 2016-10-05 DIAGNOSIS — I10 Essential (primary) hypertension: Secondary | ICD-10-CM | POA: Diagnosis not present

## 2016-10-05 DIAGNOSIS — Z803 Family history of malignant neoplasm of breast: Secondary | ICD-10-CM | POA: Diagnosis not present

## 2016-10-05 DIAGNOSIS — F17210 Nicotine dependence, cigarettes, uncomplicated: Secondary | ICD-10-CM | POA: Insufficient documentation

## 2016-10-05 DIAGNOSIS — R918 Other nonspecific abnormal finding of lung field: Secondary | ICD-10-CM | POA: Insufficient documentation

## 2016-10-05 DIAGNOSIS — R911 Solitary pulmonary nodule: Secondary | ICD-10-CM | POA: Diagnosis not present

## 2016-10-05 DIAGNOSIS — Z87891 Personal history of nicotine dependence: Secondary | ICD-10-CM | POA: Diagnosis not present

## 2016-10-05 DIAGNOSIS — Z0189 Encounter for other specified special examinations: Secondary | ICD-10-CM | POA: Diagnosis present

## 2016-10-05 DIAGNOSIS — I700 Atherosclerosis of aorta: Secondary | ICD-10-CM | POA: Diagnosis not present

## 2016-10-05 DIAGNOSIS — F419 Anxiety disorder, unspecified: Secondary | ICD-10-CM | POA: Insufficient documentation

## 2016-10-05 DIAGNOSIS — M1990 Unspecified osteoarthritis, unspecified site: Secondary | ICD-10-CM | POA: Diagnosis not present

## 2016-10-05 DIAGNOSIS — Z79899 Other long term (current) drug therapy: Secondary | ICD-10-CM | POA: Diagnosis not present

## 2016-10-05 DIAGNOSIS — E785 Hyperlipidemia, unspecified: Secondary | ICD-10-CM | POA: Diagnosis not present

## 2016-10-05 DIAGNOSIS — F329 Major depressive disorder, single episode, unspecified: Secondary | ICD-10-CM | POA: Diagnosis not present

## 2016-10-05 DIAGNOSIS — Z801 Family history of malignant neoplasm of trachea, bronchus and lung: Secondary | ICD-10-CM | POA: Insufficient documentation

## 2016-10-05 DIAGNOSIS — L929 Granulomatous disorder of the skin and subcutaneous tissue, unspecified: Secondary | ICD-10-CM | POA: Insufficient documentation

## 2016-10-05 DIAGNOSIS — H7012 Chronic mastoiditis, left ear: Secondary | ICD-10-CM | POA: Diagnosis not present

## 2016-10-05 HISTORY — DX: Unspecified osteoarthritis, unspecified site: M19.90

## 2016-10-05 LAB — CBC
HEMATOCRIT: 40.9 % (ref 35.0–47.0)
Hemoglobin: 13.7 g/dL (ref 12.0–16.0)
MCH: 28.1 pg (ref 26.0–34.0)
MCHC: 33.6 g/dL (ref 32.0–36.0)
MCV: 83.6 fL (ref 80.0–100.0)
Platelets: 263 10*3/uL (ref 150–440)
RBC: 4.9 MIL/uL (ref 3.80–5.20)
RDW: 14 % (ref 11.5–14.5)
WBC: 9.7 10*3/uL (ref 3.6–11.0)

## 2016-10-05 LAB — PROTIME-INR
INR: 0.91
Prothrombin Time: 12.2 seconds (ref 11.4–15.2)

## 2016-10-05 LAB — APTT: aPTT: 32 seconds (ref 24–36)

## 2016-10-05 MED ORDER — FENTANYL CITRATE (PF) 100 MCG/2ML IJ SOLN
INTRAMUSCULAR | Status: AC | PRN
  Administered 2016-10-05 (×3): 25 ug via INTRAVENOUS

## 2016-10-05 MED ORDER — MIDAZOLAM HCL 5 MG/5ML IJ SOLN
INTRAMUSCULAR | Status: AC | PRN
  Administered 2016-10-05 (×2): 0.5 mg via INTRAVENOUS

## 2016-10-05 MED ORDER — SODIUM CHLORIDE 0.9 % IV SOLN
INTRAVENOUS | Status: DC
  Administered 2016-10-05: 09:00:00 via INTRAVENOUS

## 2016-10-05 NOTE — Progress Notes (Signed)
Pt remains clinically stable post biopsy. Vitals as well as o2 sats stable. Recovery finished. Discharge instructions given with questions answered.

## 2016-10-05 NOTE — Progress Notes (Signed)
Patient here today for lung biopsy results.

## 2016-10-05 NOTE — Procedures (Signed)
Technically successful CT guided biopsy of hypermetabolic right upper lobe pulmonary nodule.  EBL: None.  No immediate post procedural complications.   Katherina RightJay Luan Urbani, MD Pager #: [phone]

## 2016-10-05 NOTE — Consult Note (Signed)
Chief Complaint: Indeterminate hypermetabolic pulmonary nodule  Referring Physician(s): Oaks,Timothy  Patient Status: [facility] - Out-pt  History of Present Illness: Monica Jordan is a 81 y.o. female with past medical history significant for smoking, hyperlipidemia and hypertension who was found to have indeterminate pulmonary not on screening chest radiograph confirmed on subsequent chest CT performed 10/16/2015. Subsequently PET/CT performed 09/20/2016 this nodule was found hypermetabolic without evidence of extrathoracic disease.  The patient has been seen by thoracic surgery and referred for CT-guided pulmonary nodule biopsy. She is unaccompanied and serves as her own historian.  Patient is asymptomatic in regards to this pulmonary nodule. Specifically, no fever or chills, cough or hemoptysis, unintentional weight loss or change in activity level. No chest pain or shortness of breath.  Past Medical History:  Diagnosis Date  . Anxiety   . Arthritis   . Depression   . Dizziness    1 month  . Hyperlipidemia   . Hypertension     Past Surgical History:  Procedure Laterality Date  . NO PAST SURGERIES      Allergies: Patient has no known allergies.  Medications: Prior to Admission medications   Medication Sig Start Date End Date Taking? Authorizing Provider  ALPRAZolam Prudy Feeler) 0.25 MG tablet Take 0.25 mg by mouth at bedtime as needed for anxiety.   Yes Historical Provider, MD  amLODipine (NORVASC) 5 MG tablet Take 5 mg by mouth daily.   Yes Historical Provider, MD  citalopram (CELEXA) 10 MG tablet Take 10 mg by mouth daily.   Yes Historical Provider, MD  triamterene-hydrochlorothiazide (MAXZIDE-25) 37.5-25 MG tablet Take 1 tablet by mouth daily.   Yes Historical Provider, MD  aspirin EC 81 MG tablet Take 1 tablet by mouth 1 day or 1 dose.    Historical Provider, MD  atorvastatin (LIPITOR) 20 MG tablet Take 1 tablet by mouth 1 day or 1 dose. 08/29/16   Historical Provider, MD     Family History  Problem Relation Age of Onset  . Breast cancer Sister   . Prostate cancer Brother   . Lung cancer Brother   . Healthy Mother   . Healthy Father   . Lung cancer Brother     Social History   Social History  . Marital status: Single    Spouse name: N/A  . Number of children: N/A  . Years of education: N/A   Social History Main Topics  . Smoking status: Current Every Day Smoker    Packs/day: 0.50    Years: 30.00  . Smokeless tobacco: Never Used  . Alcohol use No  . Drug use: No  . Sexual activity: Not Asked   Other Topics Concern  . None   Social History Narrative  . None    ECOG Status: 0 - Asymptomatic  Review of Systems: A 12 point ROS discussed and pertinent positives are indicated in the HPI above.  All other systems are negative.  Review of Systems  Constitutional: Negative for activity change, fatigue and fever.  Respiratory: Negative.  Negative for cough.   Cardiovascular: Negative.  Negative for chest pain.    Vital Signs: BP 138/83   Resp 18   SpO2 97%   Physical Exam  Constitutional: She appears well-developed and well-nourished.  Cardiovascular: Normal rate and regular rhythm.   Pulmonary/Chest: Effort normal and breath sounds normal.  Psychiatric: She has a normal mood and affect. Her behavior is normal.  Nursing note and vitals reviewed.   Imaging: Mr Laqueta Jean ZO Contrast  Result Date: 09/29/2016 CLINICAL DATA:  Left-sided hearing loss. Dizziness. Balance issues. Symptoms for 3 months. Pain behind the left ear. EXAM: MRI HEAD WITHOUT AND WITH CONTRAST TECHNIQUE: Multiplanar, multiecho pulse sequences of the brain and surrounding structures were obtained without and with intravenous contrast. CONTRAST:  15mL MULTIHANCE GADOBENATE DIMEGLUMINE 529 MG/ML IV SOLN COMPARISON:  None. FINDINGS: Brain: Extensive periventricular and subcortical T2 changes are present bilaterally. Remote lacunar infarcts are present in the basal ganglia  bilaterally. White matter changes extend into the brainstem. Remote lacunar infarcts are present in the posterior inferior cerebellum bilaterally. Internal auditory canals are within normal limits bilaterally. The postcontrast images demonstrate no pathologic enhancement. Vascular: Flow is present in the major intracranial arteries. Skull and upper cervical spine: The skullbase is within normal limits. The craniocervical junction is normal. The upper cervical spine is unremarkable. Midline sagittal structures are within normal limits. Sinuses/Orbits: But paranasal sinuses are clear. A left mastoid effusion is present. No obstructing nasopharyngeal lesions are present. Other: Dedicated imaging of the internal auditory canals demonstrates no pathologic enhancement. There is mild enhance of the mastoid air cells on the left. No discrete mass lesion is present. High-resolution images demonstrate the seventh eighth cranial nerves screw we. No focal mass lesion is present. The inner ear structures are normally formed. IMPRESSION: 1. Left mastoid effusion with subtle enhancement. This may represent mastoiditis which could contribute to the patient's symptoms. 2. The internal auditory canals are within normal limits bilaterally. The inner ear structures are unremarkable. 3. Advanced diffuse white matter disease bilaterally. In the setting of remote lacunar infarcts in the basal ganglia, this likely reflects chronic microvascular ischemic change. Electronically Signed   By: Marin Robertshristopher  Mattern M.D.   On: 09/29/2016 12:37   Nm Pet Image Initial (pi) Skull Base To Thigh  Result Date: 09/20/2016 CLINICAL DATA:  Initial treatment strategy for right upper lobe lung mass. EXAM: NUCLEAR MEDICINE PET SKULL BASE TO THIGH TECHNIQUE: 11.7 mCi F-18 FDG was injected intravenously. Full-ring PET imaging was performed from the skull base to thigh after the radiotracer. CT data was obtained and used for attenuation correction and  anatomic localization. FASTING BLOOD GLUCOSE:  Value: 87 mg/dl COMPARISON:  Chest CT 16/10/960411/21/2017 FINDINGS: NECK No hypermetabolic lymph nodes in the neck. CHEST No hypermetabolic mediastinal or hilar nodes. Persistent 3.2 cm spiculated mass is seen in the posterior right upper lobe. This is hypermetabolic with SUV max of 7.5, and highly suspicious for primary bronchogenic carcinoma. A 7 mm pulmonary nodule is seen in the posterior left upper lobe on image 86/3 which is new since previous study. This has SUV max of 2.0. Given rapid time course of appearance since previous CT, this is likely inflammatory or infectious in etiology. 4 mm pulmonary nodule in anterior right midlung on image 97/3 shows no associated FDG uptake, but is too small to characterize by PET. ABDOMEN/PELVIS No abnormal hypermetabolic activity within the liver, pancreas, adrenal glands, or spleen. No hypermetabolic lymph nodes in the abdomen or pelvis. Several small calcified uterine fibroids noted. Aortic atherosclerosis. SKELETON No focal hypermetabolic activity to suggest skeletal metastasis. IMPRESSION: Persistent 3.2 cm hypermetabolic spiculated mass in posterior right upper lobe, highly suspicious for primary bronchogenic carcinoma. 7 mm posterior left upper lobe pulmonary nodule shows low-grade metabolic activity and is new since previous study 1 month ago. Rapid evolution suggests inflammatory or infectious etiology. 4 mm right lung pulmonary nodule shows no associated FDG uptake, but is too small to characterize by PET. Continued attention recommended on  follow-up CT. No evidence of thoracic nodal or distant metastatic disease. Electronically Signed   By: Myles Rosenthal M.D.   On: 09/20/2016 15:15    Labs:  CBC:  Recent Labs  09/22/16 1100 10/05/16 0757  WBC 10.2 9.7  HGB 13.5 13.7  HCT 40.0 40.9  PLT 281 263    COAGS: No results for input(s): INR, APTT in the last 8760 hours.  BMP:  Recent Labs  09/22/16 1100  NA 136    K 4.2  CL 105  CO2 24  GLUCOSE 86  BUN 22*  CALCIUM 9.2  CREATININE 1.17*  GFRNONAA 43*  GFRAA 50*    LIVER FUNCTION TESTS:  Recent Labs  09/22/16 1100  BILITOT 0.4  AST 19  ALT 17  ALKPHOS 69  PROT 7.6  ALBUMIN 4.1    TUMOR MARKERS:  Recent Labs  09/22/16 1100  CEA 3.8    Assessment and Plan:  Makylah Bossard is a 81 y.o. female with past medical history significant for smoking, hyperlipidemia and hypertension who was found to have indeterminate pulmonary not on screening chest radiograph confirmed on subsequent chest CT performed 10/16/2015. Subsequently PET/CT performed 09/20/2016 this nodule was found hypermetabolic without evidence of extrathoracic disease.  The patient has been seen by thoracic surgery and referred for CT-guided pulmonary nodule biopsy.   Patient is asymptomatic in regards to this pulmonary nodule.   Risks and Benefits a CT-guided lung biopsy were discussed with the patient including, but not limited to bleeding, hemoptysis, respiratory failure requiring intubation, infection, pneumothorax requiring chest tube placement, stroke from air embolism or even death.  All of the patient's questions were answered, patient is agreeable to proceed.  Consent signed and in chart.  Thank you for this interesting consult.  I greatly enjoyed meeting Sedra Morfin and look forward to participating in their care.  A copy of this report was sent to the requesting provider on this date.  Electronically Signed: Simonne Come 10/05/2016, 8:34 AM   I spent a total of 15 Minutes in face to face in clinical consultation, greater than 50% of which was counseling/coordinating care for CT-guided lung biopsy

## 2016-10-05 NOTE — Progress Notes (Signed)
[facility]-  Cancer Center  Clinic day:  10/05/16  Chief Complaint: Monica Jordan is a 81 y.o. female with an abnormal chest CT who is seen for review of additional studies and discussion regarding direction of therapy.  HPI:  The patient was last seen in the medical oncology clinic on 09/22/2016.  At that time, PET scan revealed a 3.2 cm RUL spiculated lesion.  There was a 7 mm LUL and 4 mm right lung lesion of unclear significance.  We discussed biopsy.  We discussed referral to cardiothoracic surgery.  Labs on 09/22/2016 revealed a normal CBC with diff, CMP, and CEA (3.8).  Head MRI on 09/29/2016 revealed left mastoid effusion with subtle enhancement. This may represent mastoiditis which could contribute to the patient's symptoms.  The internal auditory canals are within normal limits bilaterally. The inner ear structures were unremarkable.  There was advanced diffuse white matter disease bilaterally. In the setting of remote lacunar infarcts in the basal ganglia, this likely reflected chronic microvascular ischemic change.  She saw Dr. Thelma Barge on 09/29/2016.  She underwent CT guided lung biopsy today.  Symptomatically, she denies any new complaints.  She has pulmonary function tests performed on 10/03/2016.   Past Medical History:  Diagnosis Date  . Anxiety   . Arthritis   . Depression   . Dizziness    1 month  . Hyperlipidemia   . Hypertension     Past Surgical History:  Procedure Laterality Date  . NO PAST SURGERIES      Family History  Problem Relation Age of Onset  . Breast cancer Sister   . Prostate cancer Brother   . Lung cancer Brother   . Healthy Mother   . Healthy Father   . Lung cancer Brother     Social History:  reports that she has been smoking.  She has a 15.00 pack-year smoking history. She has never used smokeless tobacco. She reports that she does not drink alcohol or use drugs.  The patient has smoked < 1 pack per day x 50 years.   She denies any exposure to radiation or toxins.  [name], daughter, is her medcial power of attorney.  The patient is accompanied by her daughter, Martie Lee, today.  Allergies: No Known Allergies  Current Medications: Current Outpatient Prescriptions  Medication Sig Dispense Refill  . ALPRAZolam (XANAX) 0.25 MG tablet Take 0.25 mg by mouth at bedtime as needed for anxiety.    Marland Kitchen amLODipine (NORVASC) 5 MG tablet Take 5 mg by mouth daily.    Marland Kitchen aspirin EC 81 MG tablet Take 1 tablet by mouth 1 day or 1 dose.    Marland Kitchen atorvastatin (LIPITOR) 20 MG tablet Take 1 tablet by mouth 1 day or 1 dose.    . citalopram (CELEXA) 10 MG tablet Take 10 mg by mouth daily.    Marland Kitchen triamterene-hydrochlorothiazide (MAXZIDE-25) 37.5-25 MG tablet Take 1 tablet by mouth daily.     No current facility-administered medications for this visit.    Facility-Administered Medications Ordered in Other Visits  Medication Dose Route Frequency Provider Last Rate Last Dose  . 0.9 %  sodium chloride infusion   Intravenous Continuous Ralene Muskrat, PA-C 10 mL/hr at 10/05/16 1610      Review of Systems:  GENERAL:  Fatigue.  No fevers or sweats.  Weight up 1 pound. PERFORMANCE STATUS (ECOG):  1 HEENT:  Roaring sensation in left ear since 06/2016.  No visual changes, sore throat, mouth sores or tenderness. Lungs: No  shortness of breath.  Rare cough.  No hemoptysis. Cardiac:  No chest pain, palpitations, orthopnea, or PND. GI:  No nausea, vomiting, diarrhea, constipation, melena or hematochezia. GU:  No urgency, frequency, dysuria, or hematuria.  UTI 2-3 months ago. Musculoskeletal:  No back pain.  Arthritis.  No muscle tenderness. Extremities:  No pain or swelling. Skin:  No rashes or skin changes. Neuro:  Decreased hearing left ear x 2 months.  No headache, numbness or weakness, balance or coordination issues. Endocrine:  No diabetes, thyroid issues, hot flashes or night sweats. Psych:  No mood changes, depression or  anxiety. Pain:  No focal pain. Review of systems:  All other systems reviewed and found to be negative.  Physical Exam: Blood pressure (!) 144/89, pulse 76, temperature (!) 96.5 F (35.8 C), temperature source Tympanic, resp. rate 18, weight 169 lb 15.6 oz (77.1 kg). GENERAL:  Well developed, well nourished, woman sitting comfortably in the exam room in no acute distress. MENTAL STATUS:  Alert and oriented to person, place and time. HEAD:  Short gray hair.  Normocephalic, atraumatic, face symmetric, no Cushingoid features. EYES:  Glasses.  Brown eyes.  No conjunctivitis or scleral icterus. RESPIRATORY:  Clear to auscultation without rales, wheezes or rhonchi. Neurologic:  Appropriate. PSYCH:  Appropriate.   Hospital Outpatient Visit on 10/05/2016  Component Date Value Ref Range Status  . aPTT 10/05/2016 32  24 - 36 seconds Final  . WBC 10/05/2016 9.7  3.6 - 11.0 K/uL Final  . RBC 10/05/2016 4.90  3.80 - 5.20 MIL/uL Final  . Hemoglobin 10/05/2016 13.7  12.0 - 16.0 g/dL Final  . HCT 16/10/960401/07/2017 40.9  35.0 - 47.0 % Final  . MCV 10/05/2016 83.6  80.0 - 100.0 fL Final  . MCH 10/05/2016 28.1  26.0 - 34.0 pg Final  . MCHC 10/05/2016 33.6  32.0 - 36.0 g/dL Final  . RDW 54/09/811901/07/2017 14.0  11.5 - 14.5 % Final  . Platelets 10/05/2016 263  150 - 440 K/uL Final  . Prothrombin Time 10/05/2016 12.2  11.4 - 15.2 seconds Final  . INR 10/05/2016 0.91   Final    Assessment:  Monica Jordan is a 81 y.o. female with a clinical stage IB right upper lobe mass.  She has a 15 pack year smoking history.  Chest CT on 08/15/2016 revealed a 3.2 x 2.0 spiculated density in the right upper lobe concerning for malignancy. There were mildly enlarged 8 mm precarinal lymph nodes area.  There was a 5 mm nodule or intrapulmonary lymph node in the right upper lobe just above the right minor fissure.  CEA was 3.8 on 09/22/2016.  PET scan on 09/20/2016 revealed a 3.2 cm hypermetabolic (SUV 7.5) spiculated mass in  posterior right upper lobe, highly suspicious for primary bronchogenic carcinoma.  There was a 7 mm posterior left upper lobe pulmonary nodule with low-grade metabolic activity, new since previous study 1 month ago.  Rapid evolution suggested inflammatory or infectious etiology.  There was a 4 mm right lung pulmonary nodule with no associated FDG uptake.  Head MRI on 09/29/2016 revealed left mastoid effusion with subtle enhancement. This may represent mastoiditis.  There was no evidence of metastatic disease.   Colonoscopy in 05/09/2004 revealed hyperplastic polyps.  EGD was on 05/09/2004.  Mammogram on 11/23/2014 revealed a questionable asymmetry in the posterior medial left breast.  Ultrasound revealed a 7 x 4.4 x 9 mm simple cyst in the left breast to the lateral lesion seen on mammogram. Six month follow-up  was recommended.  Symptomatically, she has been dizzy with decreased hearing in her left ear x 2 months.  Exam is stable.  Plan: 1.  Review results from head MRI.  No evidence of malignancy.  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  Follow-up with Dr. Marcello Fennel. 2.  Contact patient with results of biopsy. 3.  Obtain results of PFTs. 4.  Contact Dr. Marcello Fennel re: mastoiditis. 5.  Patient to call after procedure set up by Dr. Thelma Barge.   Rosey Bath, MD  10/05/2016, 4:27 PM

## 2016-10-06 ENCOUNTER — Ambulatory Visit: Payer: Self-pay | Admitting: Cardiothoracic Surgery

## 2016-10-09 LAB — SURGICAL PATHOLOGY

## 2016-10-13 ENCOUNTER — Ambulatory Visit: Payer: Medicare PPO | Admitting: Cardiothoracic Surgery

## 2016-10-13 ENCOUNTER — Ambulatory Visit (INDEPENDENT_AMBULATORY_CARE_PROVIDER_SITE_OTHER): Payer: Medicare PPO | Admitting: Cardiothoracic Surgery

## 2016-10-13 ENCOUNTER — Encounter: Payer: Self-pay | Admitting: Cardiothoracic Surgery

## 2016-10-13 VITALS — BP 165/80 | HR 76 | Temp 97.9°F | Ht 62.0 in | Wt 175.0 lb

## 2016-10-13 DIAGNOSIS — R918 Other nonspecific abnormal finding of lung field: Secondary | ICD-10-CM | POA: Diagnosis not present

## 2016-10-13 NOTE — Progress Notes (Signed)
  Patient ID: Monica Jordan, female   DOB: 20-Oct-1935, 81 y.o.   MRN: [id number]  HISTORY: [name] returns today in follow-up. She did undergo a CT-guided needle biopsy of her right upper lobe. She states that she has had no new complaints. She denied any shortness of breath.   Vitals:   10/13/16 1227  BP: (!) 165/80  Pulse: 76  Temp: 97.9 F (36.6 C)     EXAM:    Resp: Lungs are clear bilaterally.  No respiratory distress, normal effort. Heart:  Regular without murmurs Abd:  Abdomen is soft, non distended and non tender. No masses are palpable.  There is no rebound and no guarding.  Neurological: Alert and oriented to person, place, and time. Coordination normal.  Skin: Skin is warm and dry. No rash noted. No diaphoretic. No erythema. No pallor.  Psychiatric: Normal mood and affect. Normal behavior. Judgment and thought content normal.    ASSESSMENT: I have independently reviewed the patient's CT scan. Of also reviewed the pathology. This reveals noncaseating granulomas with inflammatory parenchymal changes.   PLAN:    I discussed her care with Dr. Merlene Pullingorcoran in oncology. I also would like her to follow-up with Dr. Belia HemanKasa in pulmonary medicine. I did not make a return visit for me at this time.    Hulda Marinimothy Kaymen Adrian, MD

## 2016-10-13 NOTE — Patient Instructions (Signed)
I will be calling you with your appointments with Dr. Belia HemanKasa and Dr. Merlene Pullingorcoran.  Please give us a call if you have any questions or concerns.

## 2016-11-05 NOTE — Progress Notes (Signed)
[facility]  Clinic day:  11/06/16  Chief Complaint: Monica Jordan is a 81 y.o. female with a RUL lung nodule who is seen for follow-up.  HPI:  The patient was last seen in the medical oncology clinic on 10/06/2015.  At that time, head MRI revealed left mastoid effusion with subtle enhancement suggestive of chronic mastoiditis.  There was no evidence of malignancy.  She had just undergone CT guided biopsy.  CT guided biopsy on 10/05/2016 revealed reactive fibrosis, prominent lymphoplasmacytic inflammation.  There was poorly formed non-caseating granuloma with multi-nucleated giant cells, intra-alveolar hemorrhage, and alveolar macrophages.  There was no evidence of malignancy, vasculitis, and polarizable material.  AFB and GMS stains were negative.  She saw Dr. Thelma Barge on 10/13/2016.  Follow-up was scheduled for follow-up with Dr Belia Heman of pulmonary medicine (appointment 11/24/2016).  She saw Ignacia Bayley, PA at the [facility] on 10/16/2016.  She was prescribed Augmentin x 10 days for chronic left sided mastoiditis.  Symptomatically, she notes no improvement in the decreased left sided hearing.  She hears a "grinding sound in the left ear".  She is not dizzy.    Past Medical History:  Diagnosis Date  . Anxiety   . Arthritis   . Depression   . Dizziness    1 month  . Hyperlipidemia   . Hypertension     Past Surgical History:  Procedure Laterality Date  . NO PAST SURGERIES      Family History  Problem Relation Age of Onset  . Breast cancer Sister   . Prostate cancer Brother   . Lung cancer Brother   . Healthy Mother   . Healthy Father   . Lung cancer Brother     Social History:  reports that she has been smoking.  She has a 15.00 pack-year smoking history. She has never used smokeless tobacco. She reports that she does not drink alcohol or use drugs.  The patient has smoked < 1 pack per day x 50 years.  She denies any exposure to radiation  or toxins.  Monica Jordan, daughter, is her medcial power of attorney.  The patient is alone today.  Allergies: No Known Allergies  Current Medications: Current Outpatient Prescriptions  Medication Sig Dispense Refill  . ALPRAZolam (XANAX) 0.25 MG tablet Take 0.25 mg by mouth at bedtime as needed for anxiety.    Marland Kitchen amLODipine (NORVASC) 5 MG tablet Take 5 mg by mouth daily.    Marland Kitchen atorvastatin (LIPITOR) 20 MG tablet Take 1 tablet by mouth 1 day or 1 dose.    . triamterene-hydrochlorothiazide (MAXZIDE-25) 37.5-25 MG tablet Take 1 tablet by mouth daily.    . citalopram (CELEXA) 10 MG tablet Take 10 mg by mouth daily.     No current facility-administered medications for this visit.     Review of Systems:  GENERAL:  Fatigue.  No fevers or sweats.  Weight up 6 pounds. PERFORMANCE STATUS (ECOG):  1 HEENT:  Grinding sensation in left ear since 06/2016 (no improvement after Augmentin).  No visual changes, sore throat, mouth sores or tenderness. Lungs: No shortness of breath or cough.  No hemoptysis. Cardiac:  No chest pain, palpitations, orthopnea, or PND. GI:  No nausea, vomiting, diarrhea, constipation, melena or hematochezia. GU:  No urgency, frequency, dysuria, or hematuria.  Musculoskeletal:  No back pain.  Arthritis.  No muscle tenderness. Extremities:  No pain or swelling. Skin:  No rashes or skin changes. Neuro:  Decreased hearing left ear x  3 months.  No dizziness.  No headache, numbness or weakness, balance or coordination issues. Endocrine:  No diabetes, thyroid issues, hot flashes or night sweats. Psych:  No mood changes, depression or anxiety. Pain:  No focal pain. Review of systems:  All other systems reviewed and found to be negative.  Physical Exam: Blood pressure (!) 157/89, pulse 78, temperature 97.1 F (36.2 C), temperature source Tympanic, resp. rate 18, weight 175 lb 4.3 oz (79.5 kg). GENERAL:  Well developed, well nourished, woman sitting comfortably in the exam room  in no acute distress. MENTAL STATUS:  Alert and oriented to person, place and time. HEAD:  Short gray hair.  Normocephalic, atraumatic, face symmetric, no Cushingoid features. EYES:  Glasses.  Brown eyes.  No conjunctivitis or scleral icterus. ENT:  Oropharynx clear without lesion.  Dentures.  Mucous membranes moist.  Tympanic membranes benign.  Non-tender mastoids. RESPIRATORY:  Clear to auscultation without rales, wheezes or rhonchi. CARDIOVASCULAR:  Regular rate and rhythm without murmur, rub or gallop. NEUROLOGICAL: Appropriate.  PSYCH:  Appropriate.   No visits with results within 3 Day(s) from this visit.  Latest known visit with results is:  Hospital Outpatient Visit on 10/05/2016  Component Date Value Ref Range Status  . aPTT 10/05/2016 32  24 - 36 seconds Final  . WBC 10/05/2016 9.7  3.6 - 11.0 K/uL Final  . RBC 10/05/2016 4.90  3.80 - 5.20 MIL/uL Final  . Hemoglobin 10/05/2016 13.7  12.0 - 16.0 g/dL Final  . HCT 07/22/2535 40.9  35.0 - 47.0 % Final  . MCV 10/05/2016 83.6  80.0 - 100.0 fL Final  . MCH 10/05/2016 28.1  26.0 - 34.0 pg Final  . MCHC 10/05/2016 33.6  32.0 - 36.0 g/dL Final  . RDW 64/40/3474 14.0  11.5 - 14.5 % Final  . Platelets 10/05/2016 263  150 - 440 K/uL Final  . Prothrombin Time 10/05/2016 12.2  11.4 - 15.2 seconds Final  . INR 10/05/2016 0.91   Final  . SURGICAL PATHOLOGY 10/05/2016    Final                   Value:Surgical Pathology CASE: [id number] PATIENT: Monica Jordan Surgical Pathology Report     SPECIMEN SUBMITTED: A. Lung, right; biopsy  CLINICAL HISTORY: Right upper lobe PET positive pulmonary nodule/mass, smoker  PRE-OPERATIVE DIAGNOSIS: Lung mass  POST-OPERATIVE DIAGNOSIS: Same as pre-op     DIAGNOSIS: A. LUNG MASS, RIGHT UPPER LOBE; CT-GUIDED BIOPSY: - ONE PARENCHYMA WITH REACTIVE FIBROSIS, PROMINENT LYMPHOPLASMACYTIC INFLAMMATION POORLY FORMED NONCASEATING GRANULOMA WITH MULTINUCLEATED GIANT CELLS, INTRA-ALVEOLAR  HEMORRHAGE, AND ALVEOLAR MACROPHAGES. - NEGATIVE FOR MALIGNANCY, VASCULITIS, AND POLARIZABLE MATERIAL. - AFB AND GMS STAINS ARE NEGATIVE.  Comment: Stain controls worked appropriately. The differential diagnosis includes sampling of tissue adjacent to the mass lesion identified on imaging and pulmonary apical cap.  GROSS DESCRIPTION:  A. Labeled: right lung biopsy  Tissue fragment(s): 2  Size: 0.4 and 1.1 cm in length and in dia                         meter 0.1  Description: brown cores, wrapped in lens paper and submitted in a mesh bag  Entirely submitted in 1-2 cassette(s).    Final Diagnosis performed by Elijah Birk, MD.  Electronically signed 10/09/2016 8:43:31AM    The electronic signature indicates that the named Attending Pathologist has evaluated the specimen  Technical component performed at [facility], [address] [place], [place] [address] Lab:  [phone] Dir: Titus DubinWilliam F. Cato MulliganHancock, MD  Professional component performed at [facility], [facility], [address] [place], [place] [address] Lab: [phone] Dir: Georgiann Cockerara C. Rubinas, MD      Assessment:  Monica RileLillian Jordan is a 81 y.o. female with a right upper lobe lesion s/p CT guided biopsy on 10/05/2016.  Pathology revealed non-caseating granuloma with multi-nucleated giant cells, intra-alveolar hemorrhage, and alveolar macrophages.  There was no evidence of malignancy, vasculitis, and polarizable material.  AFB and GMS stains were negative.  Chest CT on 08/15/2016 revealed a 3.2 x 2.0 spiculated density in the right upper lobe concerning for malignancy. There were mildly enlarged 8 mm precarinal lymph nodes area.  There was a 5 mm nodule or intrapulmonary lymph node in the right upper lobe just above the right minor fissure.  CEA was 3.8 on 09/22/2016.  PET scan on 09/20/2016 revealed a 3.2 cm hypermetabolic (SUV 7.5) spiculated mass in posterior right upper lobe, highly suspicious for primary  bronchogenic carcinoma.  There was a 7 mm posterior left upper lobe pulmonary nodule with low-grade metabolic activity, new since previous study 1 month ago.  Rapid evolution suggested inflammatory or infectious etiology.  There was a 4 mm right lung pulmonary nodule with no associated FDG uptake.  Head MRI on 09/29/2016 revealed left mastoid effusion with subtle enhancement. This may represent mastoiditis.  There was no evidence of metastatic disease.   Colonoscopy in 05/09/2004 revealed hyperplastic polyps.  EGD was on 05/09/2004.  Mammogram on 11/23/2014 revealed a questionable asymmetry in the posterior medial left breast.  Ultrasound revealed a 7 x 4.4 x 9 mm simple cyst in the left breast to the lateral lesion seen on mammogram. Six month follow-up was recommended.  Symptomatically, she has persistent decreased hearing in her left ear x 3 months.  Exam is unremarkable.  Plan: 1.  Review final pathology with non-caseating granuloma.  No evidence of malignancy. 2.  Discuss plan to follow-up with pulmonary medicine, Dr Belia HemanKasa. 3.  Consult ENT re: left mastoid effusion and decreased left sided hearing. 4.  RTC prn.   Rosey BathMelissa C Corcoran, MD  11/06/2016, 2:26 PM

## 2016-11-06 ENCOUNTER — Inpatient Hospital Stay: Payer: Medicare PPO | Attending: Hematology and Oncology | Admitting: Hematology and Oncology

## 2016-11-06 ENCOUNTER — Encounter: Payer: Self-pay | Admitting: Hematology and Oncology

## 2016-11-06 VITALS — BP 157/89 | HR 78 | Temp 97.1°F | Resp 18 | Wt 175.3 lb

## 2016-11-06 DIAGNOSIS — F17210 Nicotine dependence, cigarettes, uncomplicated: Secondary | ICD-10-CM | POA: Diagnosis not present

## 2016-11-06 DIAGNOSIS — E785 Hyperlipidemia, unspecified: Secondary | ICD-10-CM | POA: Diagnosis not present

## 2016-11-06 DIAGNOSIS — H7492 Unspecified disorder of left middle ear and mastoid: Secondary | ICD-10-CM | POA: Insufficient documentation

## 2016-11-06 DIAGNOSIS — M1990 Unspecified osteoarthritis, unspecified site: Secondary | ICD-10-CM | POA: Diagnosis not present

## 2016-11-06 DIAGNOSIS — F419 Anxiety disorder, unspecified: Secondary | ICD-10-CM | POA: Insufficient documentation

## 2016-11-06 DIAGNOSIS — F329 Major depressive disorder, single episode, unspecified: Secondary | ICD-10-CM | POA: Insufficient documentation

## 2016-11-06 DIAGNOSIS — H7012 Chronic mastoiditis, left ear: Secondary | ICD-10-CM | POA: Insufficient documentation

## 2016-11-06 DIAGNOSIS — L929 Granulomatous disorder of the skin and subcutaneous tissue, unspecified: Secondary | ICD-10-CM

## 2016-11-06 DIAGNOSIS — Z79899 Other long term (current) drug therapy: Secondary | ICD-10-CM | POA: Diagnosis not present

## 2016-11-06 DIAGNOSIS — H9192 Unspecified hearing loss, left ear: Secondary | ICD-10-CM | POA: Insufficient documentation

## 2016-11-06 DIAGNOSIS — I10 Essential (primary) hypertension: Secondary | ICD-10-CM | POA: Diagnosis not present

## 2016-11-06 DIAGNOSIS — R911 Solitary pulmonary nodule: Secondary | ICD-10-CM | POA: Diagnosis not present

## 2016-11-06 NOTE — Progress Notes (Signed)
Here for follow up. L ear issues per pt-cant hear/ dizzy intermittent-plans to f/u to an ENT in near future w referral from pcp

## 2016-11-24 ENCOUNTER — Institutional Professional Consult (permissible substitution): Payer: Medicare PPO | Admitting: Internal Medicine

## 2016-12-11 ENCOUNTER — Other Ambulatory Visit: Payer: Self-pay | Admitting: *Deleted

## 2017-01-04 ENCOUNTER — Encounter: Payer: Self-pay | Admitting: Pulmonary Disease

## 2017-01-04 ENCOUNTER — Institutional Professional Consult (permissible substitution): Payer: Medicare PPO | Admitting: Pulmonary Disease

## 2018-02-20 ENCOUNTER — Other Ambulatory Visit: Payer: Self-pay | Admitting: Internal Medicine

## 2018-02-20 DIAGNOSIS — Z1239 Encounter for other screening for malignant neoplasm of breast: Secondary | ICD-10-CM

## 2018-03-15 ENCOUNTER — Other Ambulatory Visit: Payer: Self-pay

## 2018-03-15 ENCOUNTER — Encounter: Payer: Self-pay | Admitting: Emergency Medicine

## 2018-03-15 ENCOUNTER — Emergency Department
Admission: EM | Admit: 2018-03-15 | Discharge: 2018-03-15 | Disposition: A | Discharge status: Home / Self Care | Payer: Medicare PPO | Attending: Emergency Medicine | Admitting: Emergency Medicine

## 2018-03-15 DIAGNOSIS — R040 Epistaxis: Secondary | ICD-10-CM

## 2018-03-15 DIAGNOSIS — I10 Essential (primary) hypertension: Secondary | ICD-10-CM

## 2018-03-15 DIAGNOSIS — Z79899 Other long term (current) drug therapy: Secondary | ICD-10-CM | POA: Diagnosis not present

## 2018-03-15 DIAGNOSIS — F329 Major depressive disorder, single episode, unspecified: Secondary | ICD-10-CM | POA: Insufficient documentation

## 2018-03-15 DIAGNOSIS — F17200 Nicotine dependence, unspecified, uncomplicated: Secondary | ICD-10-CM | POA: Diagnosis not present

## 2018-03-15 DIAGNOSIS — F419 Anxiety disorder, unspecified: Secondary | ICD-10-CM | POA: Insufficient documentation

## 2018-03-15 MED ORDER — AMLODIPINE BESYLATE 5 MG PO TABS: 5.0000 mg | ORAL_TABLET | Freq: Every day | ORAL | 0 refills | Status: AC

## 2018-03-15 MED ORDER — AMLODIPINE BESYLATE 5 MG PO TABS
5.0000 mg | ORAL_TABLET | Freq: Once | ORAL | Status: AC
  Administered 2018-03-15: 5 mg via ORAL
  Filled 2018-03-15: qty 1

## 2018-03-15 NOTE — ED Provider Notes (Signed)
[facility] Emergency Department Provider Note   ____________________________________________   First MD Initiated Contact with Patient 03/15/18 [phone]     (approximate)  I have reviewed the triage vital signs and the nursing notes.   HISTORY  Chief Complaint Epistaxis    HPI Monica Jordan is a 82 y.o. female who presents to the ED from home with a chief complaint of nosebleed.  Patient takes amlodipine for hypertension and states she ran out 2 to 3 days ago.  Believe she has a refill at the pharmacy.  Had a right-sided nosebleed for a few minutes 2 days ago.  Tonight she has had right-sided nosebleed twice.  Both have been controlled with direct pressure.  Patient does not take anticoagulants.  Denies recent fever, chills, headache, vision changes, chest pain, shortness of breath, abdominal pain, nausea or vomiting.  Denies recent trauma or travel.   Past Medical History:  Diagnosis Date  . Anxiety   . Arthritis   . Depression   . Dizziness    1 month  . Hyperlipidemia   . Hypertension     Patient Active Problem List   Diagnosis Date Noted  . Decreased hearing, left 11/06/2016  . Mastoid disorder, left 11/06/2016  . Non-caseating granuloma 10/05/2016  . Allergic rhinitis 09/29/2016  . Anxiety 09/29/2016  . Depression 09/29/2016  . Hypertension 09/29/2016  . Renal insufficiency 09/29/2016  . Tobacco dependence 09/29/2016  . Chronic left mastoiditis 09/29/2016    Past Surgical History:  Procedure Laterality Date  . NO PAST SURGERIES      Prior to Admission medications   Medication Sig Start Date End Date Taking? Authorizing Provider  ALPRAZolam Duanne Moron) 0.25 MG tablet Take 0.25 mg by mouth at bedtime as needed for anxiety.    [provider]  amLODipine (NORVASC) 5 MG tablet Take 1 tablet (5 mg total) by mouth daily. 03/15/18   Paulette Blanch, MD  atorvastatin (LIPITOR) 20 MG tablet Take 1 tablet by mouth 1 day or 1 dose. 08/29/16    [provider]  citalopram (CELEXA) 10 MG tablet Take 10 mg by mouth daily.    [provider]  triamterene-hydrochlorothiazide (MAXZIDE-25) 37.5-25 MG tablet Take 1 tablet by mouth daily.    [provider]    Allergies Patient has no known allergies.  Family History  Problem Relation Age of Onset  . Breast cancer Sister   . Prostate cancer Brother   . Lung cancer Brother   . Healthy Mother   . Healthy Father   . Lung cancer Brother     Social History Social History   Tobacco Use  . Smoking status: Current Every Day Smoker    Packs/day: 0.50    Years: 30.00    Pack years: 15.00  . Smokeless tobacco: Never Used  Substance Use Topics  . Alcohol use: No  . Drug use: No    Review of Systems  Constitutional: No fever/chills Eyes: No visual changes. ENT: Positive for right-sided nosebleed.  No sore throat. Cardiovascular: Denies chest pain. Respiratory: Denies shortness of breath. Gastrointestinal: No abdominal pain.  No nausea, no vomiting.  No diarrhea.  No constipation. Genitourinary: Negative for dysuria. Musculoskeletal: Negative for back pain. Skin: Negative for rash. Neurological: Negative for headaches, focal weakness or numbness.   ____________________________________________   PHYSICAL EXAM:  VITAL SIGNS: ED Triage Vitals  Enc Vitals Group     BP 03/15/18 0259 (!) 203/92     Pulse Rate 03/15/18 0259 70  Resp 03/15/18 0259 18     Temp 03/15/18 0259 98.2 F (36.8 C)     Temp Source 03/15/18 0259 Oral     SpO2 03/15/18 0259 96 %     Weight 03/15/18 0257 170 lb (77.1 kg)     Height 03/15/18 0257 '5\' 2"'  (1.575 m)     Head Circumference --      Peak Flow --      Pain Score 03/15/18 0257 0     Pain Loc --      Pain Edu? --      Excl. in [place]? --     Constitutional: Alert and oriented. Well appearing and in no acute distress. Eyes: Conjunctivae are normal. PERRL. EOMI. Head: Atraumatic. Nose: No active bleeding.  Both  nares examined with speculum: Left nare within normal limits.  Right nare with small area of irritation to anterior septum which is not bleeding. Mouth/Throat: Mucous membranes are moist.  Oropharynx non-erythematous. Neck: No stridor.   Cardiovascular: Normal rate, regular rhythm. Grossly normal heart sounds.  Good peripheral circulation. Respiratory: Normal respiratory effort.  No retractions. Lungs CTAB. Gastrointestinal: Soft and nontender. No distention. No abdominal bruits. No CVA tenderness. Musculoskeletal: No lower extremity tenderness nor edema.  No joint effusions. Neurologic:  Normal speech and language. No gross focal neurologic deficits are appreciated. No gait instability. Skin:  Skin is warm, dry and intact. No rash noted. Psychiatric: Mood and affect are normal. Speech and behavior are normal.  ____________________________________________   LABS (all labs ordered are listed, but only abnormal results are displayed)  Labs Reviewed - No data to display ____________________________________________  EKG  None ____________________________________________  RADIOLOGY  ED MD interpretation: None  Official radiology report(s): No results found.  ____________________________________________   PROCEDURES  Procedure(s) performed: None  Procedures  Critical Care performed: No  ____________________________________________   INITIAL IMPRESSION / ASSESSMENT AND PLAN / ED COURSE  As part of my medical decision making, I reviewed the following data within the electronic MEDICAL RECORD NUMBER History obtained from family, Nursing notes reviewed and incorporated and Notes from prior ED visits   82 year old female with hypertension who presents with nosebleed in the setting of running out of her antihypertensives for the past 2 to 3 days.  Repeat blood pressure is 169/102.  Will administer patient's blood pressure medicine here and write her a prescription for more.  I  offered the patient choices between packing the nose with Merocel which would offer the best option to prevent rebleeding, chemical cautery with silver nitrate stick, or applying petroleum jelly to moisturize the nose.  Patient opts for petroleum jelly.  Verbalizes understanding that her nose may bleed again.  Will discharge home with nosebleed kit with strict instructions for return.  Patient and her son verbalize understanding and agree with plan of care.      ____________________________________________   FINAL CLINICAL IMPRESSION(S) / ED DIAGNOSES  Final diagnoses:  Right-sided epistaxis  Essential hypertension     ED Discharge Orders        Ordered    amLODipine (NORVASC) 5 MG tablet  Daily     03/15/18 0344       Note:  This document was prepared using Dragon voice recognition software and may include unintentional dictation errors.    Paulette Blanch, MD 03/15/18 [phone]

## 2018-03-15 NOTE — Discharge Instructions (Signed)
1.  Restart your blood pressure medicine daily (Amlodipine 5 mg #30). 2.  If your nose starts to bleed again, apply nasal clamp, ice pack to the bridge of your nose and lean your head down.  If your nose is still bleeding after 20 minutes, please proceed to the ED for evaluation. 3.  Return to the ER for worsening symptoms, persistent vomiting, difficulty breathing or other concerns.

## 2018-03-15 NOTE — ED Triage Notes (Addendum)
Patient ambulatory to triage with steady gait, without difficulty or distress noted; pt reports right nosebleed x 2 tonight; denies any accomp symptoms; denies hx of same; denies taking any anticoagulants; no bleeding at present; hx HTN but reports did not take her BP med today

## 2019-04-08 ENCOUNTER — Encounter: Payer: Self-pay | Admitting: *Deleted

## 2019-04-16 ENCOUNTER — Ambulatory Visit: Payer: Self-pay | Admitting: Ophthalmology

## 2019-04-18 ENCOUNTER — Other Ambulatory Visit: Payer: Medicare PPO

## 2019-04-21 ENCOUNTER — Other Ambulatory Visit: Admission: RE | Admit: 2019-04-21 | Payer: Medicare PPO | Source: Ambulatory Visit

## 2019-04-21 ENCOUNTER — Other Ambulatory Visit: Payer: Medicare PPO

## 2019-04-23 NOTE — Pre-Procedure Instructions (Signed)
Telephone call to patient to confirm if she did or did not get Covid testing yesterday/today.  Patient apologized as she did not come to [facility] because she was not aware what information might have been on her paperwork.  Notified [place] to have them follow up and determine if she will be cancelled for another day.  I asked them to please verbalize to her when she does need to return for Covid testing if surgery is rescheduled.

## 2019-06-13 ENCOUNTER — Encounter: Payer: Self-pay | Admitting: *Deleted

## 2019-06-30 ENCOUNTER — Other Ambulatory Visit
Admission: RE | Admit: 2019-06-30 | Discharge: 2019-06-30 | Disposition: A | Discharge status: Home / Self Care | Payer: Medicare PPO | Source: Ambulatory Visit | Attending: Ophthalmology | Admitting: Ophthalmology

## 2019-06-30 DIAGNOSIS — Z20828 Contact with and (suspected) exposure to other viral communicable diseases: Secondary | ICD-10-CM | POA: Diagnosis not present

## 2019-06-30 DIAGNOSIS — Z01818 Encounter for other preprocedural examination: Secondary | ICD-10-CM | POA: Insufficient documentation

## 2019-06-30 LAB — SARS CORONAVIRUS 2 (TAT 6-24 HRS): SARS Coronavirus 2: NEGATIVE

## 2019-07-03 ENCOUNTER — Ambulatory Visit
Admission: RE | Admit: 2019-07-03 | Discharge: 2019-07-03 | Disposition: A | Discharge status: Home / Self Care | Payer: Medicare PPO | Attending: Ophthalmology | Admitting: Ophthalmology

## 2019-07-03 ENCOUNTER — Encounter: Payer: Self-pay | Admitting: Anesthesiology

## 2019-07-03 ENCOUNTER — Encounter
Admission: RE | Disposition: A | Discharge status: Home / Self Care | Payer: Self-pay | Source: Home / Self Care | Attending: Ophthalmology

## 2019-07-03 ENCOUNTER — Other Ambulatory Visit: Payer: Self-pay

## 2019-07-03 ENCOUNTER — Encounter: Payer: Self-pay | Admitting: *Deleted

## 2019-07-03 DIAGNOSIS — F329 Major depressive disorder, single episode, unspecified: Secondary | ICD-10-CM | POA: Diagnosis not present

## 2019-07-03 DIAGNOSIS — I10 Essential (primary) hypertension: Secondary | ICD-10-CM | POA: Insufficient documentation

## 2019-07-03 DIAGNOSIS — Z79899 Other long term (current) drug therapy: Secondary | ICD-10-CM | POA: Diagnosis not present

## 2019-07-03 DIAGNOSIS — Z791 Long term (current) use of non-steroidal anti-inflammatories (NSAID): Secondary | ICD-10-CM | POA: Diagnosis not present

## 2019-07-03 DIAGNOSIS — E7800 Pure hypercholesterolemia, unspecified: Secondary | ICD-10-CM | POA: Insufficient documentation

## 2019-07-03 DIAGNOSIS — J449 Chronic obstructive pulmonary disease, unspecified: Secondary | ICD-10-CM | POA: Insufficient documentation

## 2019-07-03 DIAGNOSIS — F419 Anxiety disorder, unspecified: Secondary | ICD-10-CM | POA: Diagnosis not present

## 2019-07-03 DIAGNOSIS — M1990 Unspecified osteoarthritis, unspecified site: Secondary | ICD-10-CM | POA: Insufficient documentation

## 2019-07-03 DIAGNOSIS — Z7982 Long term (current) use of aspirin: Secondary | ICD-10-CM | POA: Insufficient documentation

## 2019-07-03 DIAGNOSIS — F17200 Nicotine dependence, unspecified, uncomplicated: Secondary | ICD-10-CM | POA: Diagnosis not present

## 2019-07-03 DIAGNOSIS — H2512 Age-related nuclear cataract, left eye: Secondary | ICD-10-CM | POA: Insufficient documentation

## 2019-07-03 HISTORY — PX: CATARACT EXTRACTION W/PHACO: SHX586

## 2019-07-03 HISTORY — DX: Cardiac arrhythmia, unspecified: I49.9

## 2019-07-03 HISTORY — DX: Chronic obstructive pulmonary disease, unspecified: J44.9

## 2019-07-03 HISTORY — DX: Dizziness and giddiness: R42

## 2019-07-03 HISTORY — DX: Unspecified hearing loss, unspecified ear: H91.90

## 2019-07-03 SURGERY — PHACOEMULSIFICATION, CATARACT, WITH IOL INSERTION
Anesthesia: Monitor Anesthesia Care | Laterality: Left

## 2019-07-03 MED ORDER — SODIUM CHLORIDE 0.9 % IV SOLN: INTRAVENOUS | Status: DC

## 2019-07-03 MED ORDER — NA HYALUR & NA CHOND-NA HYALUR 0.55-0.5 ML IO KIT
PACK | INTRAOCULAR | Status: AC
  Filled 2019-07-03: qty 1.05

## 2019-07-03 MED ORDER — NA CHONDROIT SULF-NA HYALURON 40-17 MG/ML IO SOLN
INTRAOCULAR | Status: DC | PRN
  Administered 2019-07-03: 1 mL via INTRAOCULAR

## 2019-07-03 MED ORDER — ARMC OPHTHALMIC DILATING DROPS
OPHTHALMIC | Status: AC
  Administered 2019-07-03: 09:00:00 via OPHTHALMIC
  Administered 2019-07-03: 1 via OPHTHALMIC
  Filled 2019-07-03: qty 0.5

## 2019-07-03 MED ORDER — TRYPAN BLUE 0.06 % OP SOLN
OPHTHALMIC | Status: DC | PRN
  Administered 2019-07-03: 0.5 mL via INTRAOCULAR

## 2019-07-03 MED ORDER — TETRACAINE HCL 0.5 % OP SOLN: 1.0000 [drp] | OPHTHALMIC | Status: DC | PRN

## 2019-07-03 MED ORDER — EPINEPHRINE PF 1 MG/ML IJ SOLN
INTRAMUSCULAR | Status: AC
  Filled 2019-07-03: qty 1

## 2019-07-03 MED ORDER — NEOMYCIN-POLYMYXIN-DEXAMETH 3.5-10000-0.1 OP OINT
TOPICAL_OINTMENT | OPHTHALMIC | Status: AC
  Filled 2019-07-03: qty 3.5

## 2019-07-03 MED ORDER — NA CHONDROIT SULF-NA HYALURON 40-17 MG/ML IO SOLN
INTRAOCULAR | Status: AC
  Filled 2019-07-03: qty 1

## 2019-07-03 MED ORDER — LIDOCAINE HCL (PF) 4 % IJ SOLN
INTRAOCULAR | Status: DC | PRN
  Administered 2019-07-03: 10:00:00 2 mL via OPHTHALMIC

## 2019-07-03 MED ORDER — MOXIFLOXACIN HCL 0.5 % OP SOLN: 1.0000 [drp] | OPHTHALMIC | Status: DC | PRN

## 2019-07-03 MED ORDER — MOXIFLOXACIN HCL 0.5 % OP SOLN
OPHTHALMIC | Status: AC
  Filled 2019-07-03: qty 3

## 2019-07-03 MED ORDER — MIDAZOLAM HCL 2 MG/2ML IJ SOLN
INTRAMUSCULAR | Status: AC
  Filled 2019-07-03: qty 2

## 2019-07-03 MED ORDER — FENTANYL CITRATE (PF) 100 MCG/2ML IJ SOLN
INTRAMUSCULAR | Status: DC | PRN
  Administered 2019-07-03: 25 ug via INTRAVENOUS
  Administered 2019-07-03: 50 ug via INTRAVENOUS

## 2019-07-03 MED ORDER — ARMC OPHTHALMIC DILATING DROPS
1.0000 "application " | OPHTHALMIC | Status: AC
  Administered 2019-07-03: 09:00:00 1 via OPHTHALMIC
  Administered 2019-07-03: 09:00:00 via OPHTHALMIC

## 2019-07-03 MED ORDER — POVIDONE-IODINE 5 % OP SOLN
OPHTHALMIC | Status: AC
  Filled 2019-07-03: qty 30

## 2019-07-03 MED ORDER — SODIUM CHLORIDE 0.9 % IV SOLN
INTRAVENOUS | Status: DC
  Administered 2019-07-03 (×2): via INTRAVENOUS

## 2019-07-03 MED ORDER — EPINEPHRINE PF 1 MG/ML IJ SOLN
INTRAOCULAR | Status: DC | PRN
  Administered 2019-07-03: 10:00:00 200 mL via OPHTHALMIC

## 2019-07-03 MED ORDER — MOXIFLOXACIN HCL 0.5 % OP SOLN: 1.0000 [drp] | Freq: Once | OPHTHALMIC | Status: DC

## 2019-07-03 MED ORDER — POVIDONE-IODINE 5 % OP SOLN
OPHTHALMIC | Status: DC | PRN
  Administered 2019-07-03: 1 via OPHTHALMIC

## 2019-07-03 MED ORDER — TETRACAINE HCL 0.5 % OP SOLN
1.0000 [drp] | Freq: Once | OPHTHALMIC | Status: AC
  Administered 2019-07-03 (×2): 1 [drp] via OPHTHALMIC

## 2019-07-03 MED ORDER — TRYPAN BLUE 0.06 % OP SOLN
OPHTHALMIC | Status: AC
  Filled 2019-07-03: qty 0.5

## 2019-07-03 MED ORDER — NA HYALUR & NA CHOND-NA HYALUR 0.55-0.5 ML IO KIT
PACK | INTRAOCULAR | Status: DC | PRN
  Administered 2019-07-03: 1 via INTRAOCULAR

## 2019-07-03 MED ORDER — TETRACAINE HCL 0.5 % OP SOLN
OPHTHALMIC | Status: AC
  Administered 2019-07-03: 1 [drp] via OPHTHALMIC
  Filled 2019-07-03: qty 4

## 2019-07-03 MED ORDER — LIDOCAINE HCL (PF) 4 % IJ SOLN
INTRAMUSCULAR | Status: AC
  Filled 2019-07-03: qty 5

## 2019-07-03 MED ORDER — MOXIFLOXACIN HCL 0.5 % OP SOLN
OPHTHALMIC | Status: DC | PRN
  Administered 2019-07-03: 0.2 mL via OPHTHALMIC

## 2019-07-03 MED ORDER — FENTANYL CITRATE (PF) 100 MCG/2ML IJ SOLN
INTRAMUSCULAR | Status: AC
  Filled 2019-07-03: qty 2

## 2019-07-03 MED ORDER — ARMC OPHTHALMIC DILATING DROPS: 1.0000 "application " | OPHTHALMIC | Status: AC

## 2019-07-03 MED ORDER — MIDAZOLAM HCL 2 MG/2ML IJ SOLN
INTRAMUSCULAR | Status: DC | PRN
  Administered 2019-07-03 (×2): 1 mg via INTRAVENOUS

## 2019-07-03 SURGICAL SUPPLY — 18 items
BNDG EYE OVAL (GAUZE/BANDAGES/DRESSINGS) ×4 IMPLANT
DISSECTOR HYDRO NUCLEUS 50X22 (MISCELLANEOUS) ×12 IMPLANT
DRSG TEGADERM 2-3/8X2-3/4 SM (GAUZE/BANDAGES/DRESSINGS) ×3 IMPLANT
GLOVE BIOGEL M 6.5 STRL (GLOVE) ×3 IMPLANT
GOWN STRL REUS W/ TWL LRG LVL3 (GOWN DISPOSABLE) ×1 IMPLANT
GOWN STRL REUS W/ TWL XL LVL3 (GOWN DISPOSABLE) ×1 IMPLANT
GOWN STRL REUS W/TWL LRG LVL3 (GOWN DISPOSABLE) ×2
GOWN STRL REUS W/TWL XL LVL3 (GOWN DISPOSABLE) ×2
KNIFE 45D UP 2.3 (MISCELLANEOUS) ×3 IMPLANT
LABEL CATARACT MEDS ST (LABEL) ×3 IMPLANT
LENS IOL TECNIS ITEC 22.0 (Intraocular Lens) ×2 IMPLANT
PACK CATARACT (MISCELLANEOUS) ×3 IMPLANT
PACK CATARACT KING (MISCELLANEOUS) ×3 IMPLANT
PACK EYE AFTER SURG (MISCELLANEOUS) ×3 IMPLANT
SOL BSS BAG (MISCELLANEOUS) ×3
SOLUTION BSS BAG (MISCELLANEOUS) ×1 IMPLANT
WATER STERILE IRR 250ML POUR (IV SOLUTION) ×3 IMPLANT
WIPE NON LINTING 3.25X3.25 (MISCELLANEOUS) ×3 IMPLANT

## 2019-07-03 NOTE — Progress Notes (Signed)
   I have reviewed the patient's H&P and agree with its findings. There have been no interval changes.  Devonte Migues MD Ophthalmology 

## 2019-07-03 NOTE — Anesthesia Post-op Follow-up Note (Signed)
Anesthesia QCDR form completed.        

## 2019-07-03 NOTE — Op Note (Signed)
  PREOPERATIVE DIAGNOSIS:  Nuclear sclerotic cataract of the LEFT eye.   POSTOPERATIVE DIAGNOSIS:  Nuclear sclerotic cataract of the LEFT eye.   OPERATIVE PROCEDURE: Cataract surgery OS   SURGEON:  Marchia Meiers, MD.   ANESTHESIA:  Anesthesiologist: Alvin Critchley, MD CRNA: Allean Found, CRNA; Caryl Asp, CRNA  1.      Managed anesthesia care. 2.     0.40ml of Shugarcaine was instilled following the paracentesis   COMPLICATIONS:  None.   TECHNIQUE:   Divide and conquer   DESCRIPTION OF PROCEDURE:  The patient was examined and consented in the preoperative holding area where the aforementioned topical anesthesia was applied to the LEFT eye and then brought back to the Operating Room where the left eye was prepped and draped in the usual sterile ophthalmic fashion and a lid speculum was placed. A paracentesis was created with the side port blade, the anterior chamber was washed out with trypan blue to stain the anterior capsule, and the anterior chamber was filled with viscoelastic. A near clear corneal incision was performed with the steel keratome. A continuous curvilinear capsulorrhexis was performed with a cystotome followed by the capsulorrhexis forceps. Hydrodissection and hydrodelineation were carried out with BSS on a blunt cannula. The lens was removed in a divide and conquer  technique and the remaining cortical material was removed with the irrigation-aspiration handpiece. The capsular bag was inflated with viscoelastic and the lens was placed in the capsular bag without complication. The remaining viscoelastic was removed from the eye with the irrigation-aspiration handpiece. The wounds were hydrated. The anterior chamber was flushed and the eye was inflated to physiologic pressure. 0.56ml Vigamox was placed in the anterior chamber. The wounds were found to be water tight. The eye was dressed with Vigamox. The patient was given protective glasses to wear throughout the day and a  shield with which to sleep tonight. The patient was also given drops with which to begin a drop regimen today and will follow-up with me in one day. Implant Name Type Inv. Item Serial No. Manufacturer Lot No. LRB No. Used Action  LENS IOL DIOP 22.0 - [id number] Intraocular Lens LENS IOL DIOP 22.0 [id number]  Left 1 Implanted    Procedure(s) with comments: CATARACT EXTRACTION PHACO AND INTRAOCULAR LENS PLACEMENT (IOC) LEFT VISION BLUE (Left) - Lot #[id number] H [place]: 00:42.9 CDE: 7.54   Electronically signed: Marchia Meiers 07/03/2019 12:33 PM

## 2019-07-03 NOTE — Anesthesia Postprocedure Evaluation (Signed)
Anesthesia Post Note  Patient: Monica Jordan  Procedure(s) Performed: CATARACT EXTRACTION PHACO AND INTRAOCULAR LENS PLACEMENT (IOC) LEFT VISION BLUE (Left )  Patient location during evaluation: PACU Anesthesia Type: MAC Level of consciousness: awake and alert and oriented Pain management: pain level controlled Vital Signs Assessment: post-procedure vital signs reviewed and stable Respiratory status: spontaneous breathing Cardiovascular status: blood pressure returned to baseline Anesthetic complications: no     Last Vitals:  Vitals:   07/03/19 0820 07/03/19 1004  BP: (!) 152/79 (!) 176/67  Pulse: (!) 53 (!) 49  Resp: 18 16  Temp: (!) 36 C 36.6 C  SpO2: 98%     Last Pain:  Vitals:   07/03/19 1004  TempSrc: Temporal  PainSc: 0-No pain                 Pedro Oldenburg

## 2019-07-03 NOTE — Discharge Instructions (Signed)
Eye Surgery Discharge Instructions    Expect mild scratchy sensation or mild soreness. DO NOT RUB YOUR EYE!  The day of surgery:  Minimal physical activity, but bed rest is not required  No reading, computer work, or close hand work  No bending, lifting, or straining.  May watch TV  For 24 hours:  No driving, legal decisions, or alcoholic beverages  Safety precautions  Eat anything you prefer: It is better to start with liquids, then soup then solid foods.  _____ Eye patch should be worn until postoperative exam tomorrow.  ____ Solar shield eyeglasses should be worn for comfort in the sunlight/patch while sleeping  Resume all regular medications including aspirin or Coumadin if these were discontinued prior to surgery. You may shower, bathe, shave, or wash your hair. Tylenol may be taken for mild discomfort.  Call your doctor if you experience significant pain, nausea, or vomiting, fever > 101 or other signs of infection. [phone] or [phone] Specific instructions:  Follow-up Information    Marchia Meiers, MD Follow up on 07/04/2019.   Specialty: Ophthalmology Why: @ 10:15 am for post op visit Contact information: [address] [place] [id number] [phone]

## 2019-07-03 NOTE — Transfer of Care (Signed)
Immediate Anesthesia Transfer of Care Note  Patient: Monica Jordan  Procedure(s) Performed: CATARACT EXTRACTION PHACO AND INTRAOCULAR LENS PLACEMENT (IOC) LEFT VISION BLUE (Left )  Patient Location: PACU  Anesthesia Type:MAC  Level of Consciousness: awake, alert  and oriented  Airway & Oxygen Therapy: Patient Spontanous Breathing  Post-op Assessment: Report given to RN, Post -op Vital signs reviewed and stable and Patient moving all extremities  Post vital signs: Reviewed and stable  Last Vitals:  Vitals Value Taken Time  BP    Temp    Pulse    Resp    SpO2      Last Pain:  Vitals:   07/03/19 0820  TempSrc: Tympanic  PainSc: 0-No pain         Complications: No apparent anesthesia complications

## 2019-07-03 NOTE — Anesthesia Preprocedure Evaluation (Signed)
Anesthesia Evaluation  Patient identified by MRN, date of birth, ID band Patient awake    Reviewed: Allergy & Precautions, NPO status , Patient's Chart, lab work & pertinent test results  Airway Mallampati: III       Dental   Pulmonary COPD, Current Smoker,    Pulmonary exam normal        Cardiovascular hypertension, Normal cardiovascular exam     Neuro/Psych PSYCHIATRIC DISORDERS Anxiety Depression    GI/Hepatic   Endo/Other  negative endocrine ROS  Renal/GU Renal InsufficiencyRenal disease  negative genitourinary   Musculoskeletal  (+) Arthritis , Osteoarthritis,    Abdominal Normal abdominal exam  (+)   Peds negative pediatric ROS (+)  Hematology negative hematology ROS (+)   Anesthesia Other Findings   Reproductive/Obstetrics                             Anesthesia Physical Anesthesia Plan  ASA: III  Anesthesia Plan: MAC   Post-op Pain Management:    Induction:   PONV Risk Score and Plan:   Airway Management Planned: Nasal Cannula  Additional Equipment:   Intra-op Plan:   Post-operative Plan:   Informed Consent: I have reviewed the patients History and Physical, chart, labs and discussed the procedure including the risks, benefits and alternatives for the proposed anesthesia with the patient or authorized representative who has indicated his/her understanding and acceptance.     Dental advisory given  Plan Discussed with: CRNA and Surgeon  Anesthesia Plan Comments:         Anesthesia Quick Evaluation

## 2019-07-04 ENCOUNTER — Encounter: Payer: Self-pay | Admitting: Ophthalmology

## 2019-07-12 ENCOUNTER — Encounter: Payer: Self-pay | Admitting: Ophthalmology

## 2019-07-30 ENCOUNTER — Encounter: Payer: Self-pay | Admitting: *Deleted

## 2019-08-04 ENCOUNTER — Other Ambulatory Visit
Admission: RE | Admit: 2019-08-04 | Discharge: 2019-08-04 | Disposition: A | Discharge status: Home / Self Care | Payer: Medicare PPO | Source: Ambulatory Visit | Attending: Ophthalmology | Admitting: Ophthalmology

## 2019-08-04 ENCOUNTER — Other Ambulatory Visit: Payer: Self-pay

## 2019-08-04 DIAGNOSIS — Z20828 Contact with and (suspected) exposure to other viral communicable diseases: Secondary | ICD-10-CM | POA: Diagnosis not present

## 2019-08-04 DIAGNOSIS — Z01812 Encounter for preprocedural laboratory examination: Secondary | ICD-10-CM | POA: Insufficient documentation

## 2019-08-04 LAB — SARS CORONAVIRUS 2 (TAT 6-24 HRS): SARS Coronavirus 2: NEGATIVE

## 2019-08-07 ENCOUNTER — Ambulatory Visit: Payer: Medicare PPO | Admitting: Anesthesiology

## 2019-08-07 ENCOUNTER — Ambulatory Visit
Admission: RE | Admit: 2019-08-07 | Discharge: 2019-08-07 | Disposition: A | Discharge status: Home / Self Care | Payer: Medicare PPO | Attending: Ophthalmology | Admitting: Ophthalmology

## 2019-08-07 ENCOUNTER — Other Ambulatory Visit: Payer: Self-pay

## 2019-08-07 ENCOUNTER — Encounter
Admission: RE | Disposition: A | Discharge status: Home / Self Care | Payer: Self-pay | Source: Home / Self Care | Attending: Ophthalmology

## 2019-08-07 ENCOUNTER — Encounter: Payer: Self-pay | Admitting: *Deleted

## 2019-08-07 DIAGNOSIS — H2511 Age-related nuclear cataract, right eye: Secondary | ICD-10-CM | POA: Insufficient documentation

## 2019-08-07 DIAGNOSIS — J449 Chronic obstructive pulmonary disease, unspecified: Secondary | ICD-10-CM | POA: Insufficient documentation

## 2019-08-07 DIAGNOSIS — F17210 Nicotine dependence, cigarettes, uncomplicated: Secondary | ICD-10-CM | POA: Diagnosis not present

## 2019-08-07 DIAGNOSIS — I10 Essential (primary) hypertension: Secondary | ICD-10-CM | POA: Insufficient documentation

## 2019-08-07 HISTORY — DX: Sciatica, unspecified side: M54.30

## 2019-08-07 HISTORY — DX: Bronchitis, not specified as acute or chronic: J40

## 2019-08-07 HISTORY — PX: CATARACT EXTRACTION W/PHACO: SHX586

## 2019-08-07 HISTORY — DX: Other allergic rhinitis: J30.89

## 2019-08-07 SURGERY — PHACOEMULSIFICATION, CATARACT, WITH IOL INSERTION
Anesthesia: Monitor Anesthesia Care | Laterality: Right

## 2019-08-07 MED ORDER — GLYCOPYRROLATE 0.2 MG/ML IJ SOLN
INTRAMUSCULAR | Status: DC | PRN
  Administered 2019-08-07: 0.1 mg via INTRAVENOUS

## 2019-08-07 MED ORDER — ARMC OPHTHALMIC DILATING DROPS
1.0000 "application " | OPHTHALMIC | Status: AC
  Administered 2019-08-07 (×3): 1 via OPHTHALMIC

## 2019-08-07 MED ORDER — MIDAZOLAM HCL 2 MG/2ML IJ SOLN
INTRAMUSCULAR | Status: DC | PRN
  Administered 2019-08-07: 2 mg via INTRAVENOUS

## 2019-08-07 MED ORDER — MOXIFLOXACIN HCL 0.5 % OP SOLN
OPHTHALMIC | Status: DC | PRN
  Administered 2019-08-07: 0.2 mL via OPHTHALMIC

## 2019-08-07 MED ORDER — NA CHONDROIT SULF-NA HYALURON 40-17 MG/ML IO SOLN
INTRAOCULAR | Status: AC
  Filled 2019-08-07: qty 1

## 2019-08-07 MED ORDER — TRYPAN BLUE 0.06 % OP SOLN
OPHTHALMIC | Status: DC | PRN
  Administered 2019-08-07: 0.5 mL via INTRAOCULAR

## 2019-08-07 MED ORDER — TETRACAINE HCL 0.5 % OP SOLN
1.0000 [drp] | Freq: Two times a day (BID) | OPHTHALMIC | Status: DC
  Administered 2019-08-07: 11:00:00 1 [drp] via OPHTHALMIC

## 2019-08-07 MED ORDER — LIDOCAINE HCL (PF) 4 % IJ SOLN
INTRAOCULAR | Status: DC | PRN
  Administered 2019-08-07: 11:00:00 2 mL via OPHTHALMIC

## 2019-08-07 MED ORDER — MOXIFLOXACIN HCL 0.5 % OP SOLN: 1.0000 [drp] | Freq: Once | OPHTHALMIC | Status: DC

## 2019-08-07 MED ORDER — EPINEPHRINE PF 1 MG/ML IJ SOLN
INTRAMUSCULAR | Status: AC
  Filled 2019-08-07: qty 1

## 2019-08-07 MED ORDER — NEOMYCIN-POLYMYXIN-DEXAMETH 3.5-10000-0.1 OP OINT
TOPICAL_OINTMENT | OPHTHALMIC | Status: AC
  Filled 2019-08-07: qty 3.5

## 2019-08-07 MED ORDER — LIDOCAINE HCL (PF) 4 % IJ SOLN
INTRAMUSCULAR | Status: AC
  Filled 2019-08-07: qty 5

## 2019-08-07 MED ORDER — NA HYALUR & NA CHOND-NA HYALUR 0.55-0.5 ML IO KIT
PACK | INTRAOCULAR | Status: AC
  Filled 2019-08-07: qty 1.05

## 2019-08-07 MED ORDER — POVIDONE-IODINE 5 % OP SOLN
OPHTHALMIC | Status: AC
  Filled 2019-08-07: qty 30

## 2019-08-07 MED ORDER — DEXMEDETOMIDINE HCL 200 MCG/2ML IV SOLN
INTRAVENOUS | Status: DC | PRN
  Administered 2019-08-07: 4 ug via INTRAVENOUS

## 2019-08-07 MED ORDER — NA CHONDROIT SULF-NA HYALURON 40-17 MG/ML IO SOLN
INTRAOCULAR | Status: DC | PRN
  Administered 2019-08-07: 1 mL via INTRAOCULAR

## 2019-08-07 MED ORDER — MIDAZOLAM HCL 2 MG/2ML IJ SOLN
INTRAMUSCULAR | Status: AC
  Filled 2019-08-07: qty 2

## 2019-08-07 MED ORDER — ONDANSETRON HCL 4 MG/2ML IJ SOLN
INTRAMUSCULAR | Status: DC | PRN
  Administered 2019-08-07: 4 mg via INTRAVENOUS

## 2019-08-07 MED ORDER — SODIUM CHLORIDE 0.9 % IV SOLN
INTRAVENOUS | Status: DC
  Administered 2019-08-07: 10:00:00 via INTRAVENOUS

## 2019-08-07 MED ORDER — POVIDONE-IODINE 5 % OP SOLN
OPHTHALMIC | Status: DC | PRN
  Administered 2019-08-07: 1 via OPHTHALMIC

## 2019-08-07 MED ORDER — ARMC OPHTHALMIC DILATING DROPS
OPHTHALMIC | Status: AC
  Filled 2019-08-07: qty 0.5

## 2019-08-07 MED ORDER — TETRACAINE HCL 0.5 % OP SOLN
OPHTHALMIC | Status: AC
  Administered 2019-08-07: 10:00:00
  Filled 2019-08-07: qty 4

## 2019-08-07 MED ORDER — MOXIFLOXACIN HCL 0.5 % OP SOLN
OPHTHALMIC | Status: AC
  Filled 2019-08-07: qty 3

## 2019-08-07 MED ORDER — TRYPAN BLUE 0.06 % OP SOLN
OPHTHALMIC | Status: AC
  Filled 2019-08-07: qty 0.5

## 2019-08-07 MED ORDER — FLUMAZENIL 0.5 MG/5ML IV SOLN
INTRAVENOUS | Status: DC | PRN
  Administered 2019-08-07 (×2): .05 mg via INTRAVENOUS

## 2019-08-07 MED ORDER — NA HYALUR & NA CHOND-NA HYALUR 0.55-0.5 ML IO KIT
PACK | INTRAOCULAR | Status: DC | PRN
  Administered 2019-08-07: 1 via INTRAOCULAR

## 2019-08-07 MED ORDER — EPINEPHRINE PF 1 MG/ML IJ SOLN
INTRAOCULAR | Status: DC | PRN
  Administered 2019-08-07: 11:00:00 200 mL via OPHTHALMIC

## 2019-08-07 SURGICAL SUPPLY — 18 items
BNDG EYE OVAL (GAUZE/BANDAGES/DRESSINGS) ×6 IMPLANT
DISSECTOR HYDRO NUCLEUS 50X22 (MISCELLANEOUS) ×12 IMPLANT
DRSG TEGADERM 2-3/8X2-3/4 SM (GAUZE/BANDAGES/DRESSINGS) ×3 IMPLANT
GLOVE BIOGEL M 6.5 STRL (GLOVE) ×3 IMPLANT
GOWN STRL REUS W/ TWL LRG LVL3 (GOWN DISPOSABLE) ×1 IMPLANT
GOWN STRL REUS W/ TWL XL LVL3 (GOWN DISPOSABLE) ×1 IMPLANT
GOWN STRL REUS W/TWL LRG LVL3 (GOWN DISPOSABLE) ×2
GOWN STRL REUS W/TWL XL LVL3 (GOWN DISPOSABLE) ×2
KNIFE 45D UP 2.3 (MISCELLANEOUS) ×3 IMPLANT
LABEL CATARACT MEDS ST (LABEL) ×3 IMPLANT
LENS IOL TECNIS ITEC 23.0 (Intraocular Lens) ×3 IMPLANT
PACK CATARACT (MISCELLANEOUS) ×3 IMPLANT
PACK CATARACT KING (MISCELLANEOUS) ×3 IMPLANT
PACK EYE AFTER SURG (MISCELLANEOUS) ×3 IMPLANT
SOL BSS BAG (MISCELLANEOUS) ×3
SOLUTION BSS BAG (MISCELLANEOUS) ×1 IMPLANT
WATER STERILE IRR 250ML POUR (IV SOLUTION) ×3 IMPLANT
WIPE NON LINTING 3.25X3.25 (MISCELLANEOUS) ×3 IMPLANT

## 2019-08-07 NOTE — Discharge Instructions (Signed)
Eye Surgery Discharge Instructions    Expect mild scratchy sensation or mild soreness. DO NOT RUB YOUR EYE!  The day of surgery:  Minimal physical activity, but bed rest is not required  No reading, computer work, or close hand work  No bending, lifting, or straining.  May watch TV  For 24 hours:  No driving, legal decisions, or alcoholic beverages  Safety precautions  Eat anything you prefer: It is better to start with liquids, then soup then solid foods.  _____ Eye patch should be worn until postoperative exam tomorrow.  ____ Solar shield eyeglasses should be worn for comfort in the sunlight/patch while sleeping  Resume all regular medications including aspirin or Coumadin if these were discontinued prior to surgery. You may shower, bathe, shave, or wash your hair. Tylenol may be taken for mild discomfort.  Call your doctor if you experience significant pain, nausea, or vomiting, fever > 101 or other signs of infection. [phone] or [phone] Specific instructions:  Follow-up Information    Marchia Meiers, MD Follow up on 08/08/2019.   Specialty: Ophthalmology Why: @ 9:50 am for post op visit Contact information: [place] [address] [phone]

## 2019-08-07 NOTE — H&P (Signed)
   I have reviewed the patient's H&P and agree with its findings. There have been no interval changes.  Merla Sawka MD Ophthalmology 

## 2019-08-07 NOTE — Anesthesia Post-op Follow-up Note (Signed)
Anesthesia QCDR form completed.        

## 2019-08-07 NOTE — Op Note (Signed)
  PREOPERATIVE DIAGNOSIS:  Nuclear sclerotic cataract of the RIGHT eye.   POSTOPERATIVE DIAGNOSIS:  Nuclear sclerotic cataract of the RIGHT eye.   OPERATIVE PROCEDURE: Cataract surgery OD   SURGEON:  Marchia Meiers, MD.   ANESTHESIA:  Anesthesiologist: Alphonsus Sias, MD CRNA: Disser, Dierdre Forth, CRNA  1.      Managed anesthesia care. 2.     0.14ml of Shugarcaine was instilled following the paracentesis   COMPLICATIONS:  None.   TECHNIQUE:   Divide and conquer   DESCRIPTION OF PROCEDURE:  The patient was examined and consented in the preoperative holding area where the aforementioned topical anesthesia was applied to the RIGHT eye and then brought back to the Operating Room where the RIGHT eye was prepped and draped in the usual sterile ophthalmic fashion and a lid speculum was placed. A paracentesis was created with the side port blade, the anterior chamber was washed out with trypan blue to stain the anterior capsule, and the anterior chamber was filled with viscoelastic. A near clear corneal incision was performed with the steel keratome. A continuous curvilinear capsulorrhexis was performed with a cystotome followed by the capsulorrhexis forceps. Hydrodissection and hydrodelineation were carried out with BSS on a blunt cannula. The lens was removed in a divide and conquer  technique and the remaining cortical material was removed with the irrigation-aspiration handpiece. The capsular bag was inflated with viscoelastic and the lens was placed in the capsular bag without complication. The remaining viscoelastic was removed from the eye with the irrigation-aspiration handpiece. The wounds were hydrated. The anterior chamber was flushed and the eye was inflated to physiologic pressure. 0.44ml Vigamox was placed in the anterior chamber. The wounds were found to be water tight. The eye was dressed with Vigamox. The patient was given protective glasses to wear throughout the day and a shield with which  to sleep tonight. The patient was also given drops with which to begin a drop regimen today and will follow-up with me in one day. Implant Name Type Inv. Item Serial No. Manufacturer Lot No. LRB No. Used Action  LENS IOL DIOP 23.0 - [id number] Intraocular Lens LENS IOL DIOP 23.0 [id number] [place]  Right 1 Implanted    Procedure(s) with comments: CATARACT EXTRACTION PHACO AND INTRAOCULAR LENS PLACEMENT (IOC) RIGHT VISION BLUE (Right) - Lot #[id number] [place]: 01:00.0 CDE: 9.40   Electronically signed: Marchia Meiers 08/07/2019 1:14 PM

## 2019-08-07 NOTE — Anesthesia Preprocedure Evaluation (Addendum)
Anesthesia Evaluation  Patient identified by MRN, date of birth, ID band Patient awake    Reviewed: Allergy & Precautions, H&P , NPO status , reviewed documented beta blocker date and time   Airway Mallampati: III  TM Distance: >3 FB Neck ROM: full    Dental  (+) Upper Dentures, Partial Lower, Chipped, Missing   Pulmonary COPD, Current Smoker and Patient abstained from smoking.,    Pulmonary exam normal        Cardiovascular hypertension, Normal cardiovascular exam+ dysrhythmias      Neuro/Psych PSYCHIATRIC DISORDERS Anxiety Depression  Neuromuscular disease    GI/Hepatic neg GERD  ,  Endo/Other    Renal/GU Renal disease     Musculoskeletal  (+) Arthritis ,   Abdominal   Peds  Hematology   Anesthesia Other Findings Past Medical History: No date: Anxiety No date: Arthritis No date: Bronchitis No date: COPD (chronic obstructive pulmonary disease) (HCC) No date: Depression No date: Dizziness     Comment:  1 month No date: Dysrhythmia No date: Environmental and seasonal allergies No date: HOH (hard of hearing)     Comment:  left ear No date: Hyperlipidemia No date: Hypertension No date: Sciatica No date: Vertigo  Past Surgical History: 07/03/2019: CATARACT EXTRACTION W/PHACO; Left     Comment:  Procedure: CATARACT EXTRACTION PHACO AND INTRAOCULAR               LENS PLACEMENT ([place]) LEFT VISION BLUE;  Surgeon: Marchia Meiers, MD;  Location: [facility] ORS;  Service: Ophthalmology;               Laterality: Left;  Lot #[id number] H [place]: 00:42.9 CDE:               7.54  No date: COLONOSCOPY  BMI    Body Mass Index: 31.83 kg/m      Reproductive/Obstetrics                            Anesthesia Physical Anesthesia Plan  ASA: III  Anesthesia Plan: MAC   Post-op Pain Management:    Induction: Intravenous  PONV Risk Score and Plan: Treatment may vary due to age or medical  condition and TIVA  Airway Management Planned: Nasal Cannula and Natural Airway  Additional Equipment:   Intra-op Plan:   Post-operative Plan:   Informed Consent: I have reviewed the patients History and Physical, chart, labs and discussed the procedure including the risks, benefits and alternatives for the proposed anesthesia with the patient or authorized representative who has indicated his/her understanding and acceptance.     Dental Advisory Given  Plan Discussed with: CRNA  Anesthesia Plan Comments:         Anesthesia Quick Evaluation

## 2019-08-07 NOTE — Transfer of Care (Signed)
Immediate Anesthesia Transfer of Care Note  Patient: Monica Jordan  Procedure(s) Performed: CATARACT EXTRACTION PHACO AND INTRAOCULAR LENS PLACEMENT (IOC) RIGHT VISION BLUE (Right )  Patient Location: PACU  Anesthesia Type:MAC  Level of Consciousness: awake, alert  and oriented  Airway & Oxygen Therapy: Patient Spontanous Breathing  Post-op Assessment: Report given to RN  Post vital signs: Reviewed  Last Vitals:  Vitals Value Taken Time  BP    Temp 36.3 C 08/07/19 1210  Pulse    Resp    SpO2      Last Pain:  Vitals:   08/07/19 0912  TempSrc: Temporal  PainSc: 0-No pain         Complications: No apparent anesthesia complications

## 2019-08-08 ENCOUNTER — Encounter: Payer: Self-pay | Admitting: Ophthalmology

## 2019-08-14 NOTE — Anesthesia Postprocedure Evaluation (Signed)
Anesthesia Post Note  Patient: Ova Gillentine  Procedure(s) Performed: CATARACT EXTRACTION PHACO AND INTRAOCULAR LENS PLACEMENT (IOC) RIGHT VISION BLUE (Right )  Patient location during evaluation: Phase II Anesthesia Type: MAC Level of consciousness: awake and alert Pain management: pain level controlled Vital Signs Assessment: post-procedure vital signs reviewed and stable Respiratory status: spontaneous breathing, nonlabored ventilation and respiratory function stable Cardiovascular status: blood pressure returned to baseline and stable Postop Assessment: no apparent nausea or vomiting Anesthetic complications: no     Last Vitals:  Vitals:   08/07/19 1209 08/07/19 1217  BP: (!) 163/67   Pulse: (!) 47 (!) 58  Resp: 16 16  Temp: 36.7 C   SpO2: 97%     Last Pain:  Vitals:   08/07/19 1209  TempSrc: Temporal  PainSc: 0-No pain                 Alphonsus Sias

## 2020-07-19 ENCOUNTER — Ambulatory Visit: Payer: Medicare PPO | Attending: Internal Medicine

## 2020-07-19 DIAGNOSIS — Z23 Encounter for immunization: Secondary | ICD-10-CM

## 2020-07-19 NOTE — Progress Notes (Signed)
   Covid-19 Vaccination Clinic  Name:  Rubina Basinski    MRN: [id number] DOB: 1936-02-27  07/19/2020  Ms. Korff was observed post Covid-19 immunization for 15 minutes without incident. She was provided with Vaccine Information Sheet and instruction to access the V-Safe system.   Ms. Freiman was instructed to call 911 with any severe reactions post vaccine: Marland Kitchen Difficulty breathing  . Swelling of face and throat  . A fast heartbeat  . A bad rash all over body  . Dizziness and weakness

## 2020-11-18 ENCOUNTER — Other Ambulatory Visit: Payer: Self-pay | Admitting: Internal Medicine

## 2020-11-18 DIAGNOSIS — N6489 Other specified disorders of breast: Secondary | ICD-10-CM

## 2021-01-21 ENCOUNTER — Ambulatory Visit: Payer: Medicare PPO

## 2021-03-04 ENCOUNTER — Other Ambulatory Visit: Payer: Self-pay | Admitting: Internal Medicine

## 2021-03-04 DIAGNOSIS — Z1231 Encounter for screening mammogram for malignant neoplasm of breast: Secondary | ICD-10-CM

## 2021-07-06 ENCOUNTER — Other Ambulatory Visit: Payer: Self-pay | Admitting: Internal Medicine

## 2021-07-06 DIAGNOSIS — J449 Chronic obstructive pulmonary disease, unspecified: Secondary | ICD-10-CM

## 2021-07-06 DIAGNOSIS — R911 Solitary pulmonary nodule: Secondary | ICD-10-CM

## 2021-07-26 ENCOUNTER — Other Ambulatory Visit: Payer: Self-pay

## 2021-07-26 ENCOUNTER — Ambulatory Visit
Admission: RE | Admit: 2021-07-26 | Discharge: 2021-07-26 | Disposition: A | Discharge status: Home / Self Care | Payer: Medicare Other | Source: Ambulatory Visit | Attending: Internal Medicine | Admitting: Internal Medicine

## 2021-07-26 DIAGNOSIS — J449 Chronic obstructive pulmonary disease, unspecified: Secondary | ICD-10-CM | POA: Insufficient documentation

## 2021-07-26 DIAGNOSIS — R911 Solitary pulmonary nodule: Secondary | ICD-10-CM | POA: Insufficient documentation

## 2021-07-26 LAB — POCT I-STAT CREATININE: Creatinine, Ser: 1.3 mg/dL — ABNORMAL HIGH (ref 0.44–1.00)

## 2021-07-26 MED ORDER — IOHEXOL 300 MG/ML  SOLN
75.0000 mL | Freq: Once | INTRAMUSCULAR | Status: AC | PRN
  Administered 2021-07-26: 75 mL via INTRAVENOUS

## 2021-08-04 ENCOUNTER — Encounter (INDEPENDENT_AMBULATORY_CARE_PROVIDER_SITE_OTHER): Payer: Self-pay

## 2021-08-04 ENCOUNTER — Inpatient Hospital Stay: Payer: Medicare Other | Attending: Oncology | Admitting: Oncology

## 2021-08-04 ENCOUNTER — Encounter: Payer: Self-pay | Admitting: *Deleted

## 2021-08-04 ENCOUNTER — Other Ambulatory Visit: Payer: Self-pay

## 2021-08-04 ENCOUNTER — Inpatient Hospital Stay: Payer: Medicare Other

## 2021-08-04 ENCOUNTER — Encounter: Payer: Self-pay | Admitting: Oncology

## 2021-08-04 VITALS — BP 135/71 | HR 69 | Temp 98.0°F | Resp 16 | Wt 164.2 lb

## 2021-08-04 DIAGNOSIS — Z801 Family history of malignant neoplasm of trachea, bronchus and lung: Secondary | ICD-10-CM | POA: Diagnosis not present

## 2021-08-04 DIAGNOSIS — F17210 Nicotine dependence, cigarettes, uncomplicated: Secondary | ICD-10-CM | POA: Insufficient documentation

## 2021-08-04 DIAGNOSIS — Z803 Family history of malignant neoplasm of breast: Secondary | ICD-10-CM | POA: Diagnosis not present

## 2021-08-04 DIAGNOSIS — R911 Solitary pulmonary nodule: Secondary | ICD-10-CM | POA: Diagnosis present

## 2021-08-04 DIAGNOSIS — Z8042 Family history of malignant neoplasm of prostate: Secondary | ICD-10-CM | POA: Insufficient documentation

## 2021-08-04 NOTE — Progress Notes (Signed)
[facility]  [phone] [phone]  ID: Monica Jordan OB: 02-May-1936  MR#: [id number]  HUT#:[id number]  Patient Care Team: Barbette Reichmann, MD as PCP - General (Internal Medicine)  CHIEF COMPLAINT: Left upper lobe lung nodule  INTERVAL HISTORY: Patient is an 85 year old female who was noted to have a left upper lobe lung nodule as far back as 2017, but more recently it has increased in size to 3.8 cm.  She currently feels well and is asymptomatic.  She has no neurologic complaints.  She denies any recent fevers or illnesses.  She has a good appetite and denies weight loss.  She has no chest pain, shortness of breath, cough, or hemoptysis.  She denies any nausea, vomiting, constipation, or diarrhea.  She has no urinary complaints.  Patient feels at her baseline and offers no specific complaints today.  REVIEW OF SYSTEMS:   Review of Systems  Constitutional: Negative.  Negative for fever, malaise/fatigue and weight loss.  Respiratory: Negative.  Negative for cough, hemoptysis and shortness of breath.   Cardiovascular: Negative.  Negative for chest pain and leg swelling.  Gastrointestinal: Negative.  Negative for abdominal pain.  Genitourinary: Negative.  Negative for dysuria.  Musculoskeletal: Negative.  Negative for back pain.  Skin: Negative.  Negative for rash.  Neurological: Negative.  Negative for dizziness, focal weakness, weakness and headaches.  Psychiatric/Behavioral: Negative.  The patient is not nervous/anxious.    As per HPI. Otherwise, a complete review of systems is negative.  PAST MEDICAL HISTORY: Past Medical History:  Diagnosis Date   Anxiety    Arthritis    Bronchitis    COPD (chronic obstructive pulmonary disease) (HCC)    Depression    Dizziness    1 month   Dysrhythmia    Environmental and seasonal allergies    HOH (hard of hearing)    left ear   Hyperlipidemia    Hypertension    Sciatica    Vertigo     PAST  SURGICAL HISTORY: Past Surgical History:  Procedure Laterality Date   CATARACT EXTRACTION W/PHACO Left 07/03/2019   Procedure: CATARACT EXTRACTION PHACO AND INTRAOCULAR LENS PLACEMENT (IOC) LEFT VISION BLUE;  Surgeon: Elliot Cousin, MD;  Location: [facility] ORS;  Service: Ophthalmology;  Laterality: Left;  Lot #[id number] H [place]: 00:42.9 CDE: 7.54    CATARACT EXTRACTION W/PHACO Right 08/07/2019   Procedure: CATARACT EXTRACTION PHACO AND INTRAOCULAR LENS PLACEMENT (IOC) RIGHT VISION BLUE;  Surgeon: Elliot Cousin, MD;  Location: [facility] ORS;  Service: Ophthalmology;  Laterality: Right;  Lot #[id number] H [place]: 01:00.0 CDE: 9.40    COLONOSCOPY      FAMILY HISTORY: Family History  Problem Relation Age of Onset   Breast cancer Sister    Prostate cancer Brother    Lung cancer Brother    Healthy Mother    Healthy Father    Lung cancer Brother     ADVANCED DIRECTIVES (Y/N):  N  HEALTH MAINTENANCE: Social History   Tobacco Use   Smoking status: Every Day    Packs/day: 0.50    Years: 30.00    Pack years: 15.00    Types: Cigarettes   Smokeless tobacco: Never  Vaping Use   Vaping Use: Never used  Substance Use Topics   Alcohol use: No   Drug use: No     Colonoscopy:  PAP:  Bone density:  Lipid panel:  No Known Allergies  Current Outpatient Medications  Medication Sig Dispense Refill   albuterol (VENTOLIN HFA) 108 (90 Base) MCG/ACT inhaler Inhale  into the lungs as needed for wheezing or shortness of breath. Takes in the morning if needed     ALPRAZolam (XANAX) 0.25 MG tablet Take 0.25 mg by mouth 2 (two) times daily.     amLODipine (NORVASC) 10 MG tablet Take 1 tablet by mouth daily.     aspirin EC 81 MG tablet Take 81 mg by mouth daily.     buPROPion (WELLBUTRIN XL) 150 MG 24 hr tablet Take 150 mg by mouth daily.     citalopram (CELEXA) 10 MG tablet Take 10 mg by mouth daily.     clobetasol ointment (TEMOVATE) 0.05 % USE 1 FINGERTIP NIGHTLY FOR 4 WEEKS.     conjugated estrogens  (PREMARIN) vaginal cream Place vaginally.     meloxicam (MOBIC) 7.5 MG tablet Take 7.5 mg by mouth daily. In the morning     metoprolol tartrate (LOPRESSOR) 25 MG tablet Take 25 mg by mouth daily. Takes in the morning     rosuvastatin (CRESTOR) 5 MG tablet Take by mouth.     traMADol (ULTRAM) 50 MG tablet Take by mouth.     amLODipine (NORVASC) 5 MG tablet Take 1 tablet (5 mg total) by mouth daily. (Patient not taking: Reported on 08/04/2021) 30 tablet 0   atorvastatin (LIPITOR) 20 MG tablet Take 20 mg by mouth daily. Takes in the morning (Patient not taking: Reported on 08/04/2021)     metoprolol succinate (TOPROL-XL) 25 MG 24 hr tablet Take 25 mg by mouth daily. (Patient not taking: Reported on 08/04/2021)     triamterene-hydrochlorothiazide (MAXZIDE-25) 37.5-25 MG tablet Take 1 tablet by mouth daily. (Patient not taking: Reported on 08/04/2021)     No current facility-administered medications for this visit.    OBJECTIVE: Vitals:   08/04/21 1304  BP: 135/71  Pulse: 69  Resp: 16  Temp: 98 F (36.7 C)  SpO2: 98%     Body mass index is 30.03 kg/m.    ECOG FS:0 - Asymptomatic  General: Well-developed, well-nourished, no acute distress. Eyes: Pink conjunctiva, anicteric sclera. HEENT: Normocephalic, moist mucous membranes. Lungs: No audible wheezing or coughing. Heart: Regular rate and rhythm. Abdomen: Soft, nontender, no obvious distention. Musculoskeletal: No edema, cyanosis, or clubbing. Neuro: Alert, answering all questions appropriately. Cranial nerves grossly intact. Skin: No rashes or petechiae noted. Psych: Normal affect. Lymphatics: No cervical, calvicular, axillary or inguinal LAD.   LAB RESULTS:  Lab Results  Component Value Date   NA 136 09/22/2016   K 4.2 09/22/2016   CL 105 09/22/2016   CO2 24 09/22/2016   GLUCOSE 86 09/22/2016   BUN 22 (H) 09/22/2016   CREATININE 1.30 (H) 07/26/2021   CALCIUM 9.2 09/22/2016   PROT 7.6 09/22/2016   ALBUMIN 4.1 09/22/2016    AST 19 09/22/2016   ALT 17 09/22/2016   ALKPHOS 69 09/22/2016   BILITOT 0.4 09/22/2016   GFRNONAA 43 (L) 09/22/2016   GFRAA 50 (L) 09/22/2016    Lab Results  Component Value Date   WBC 9.7 10/05/2016   NEUTROABS 6.8 (H) 09/22/2016   HGB 13.7 10/05/2016   HCT 40.9 10/05/2016   MCV 83.6 10/05/2016   PLT 263 10/05/2016     STUDIES: CT CHEST W CONTRAST  Result Date: 07/26/2021 CLINICAL DATA:  Follow-up lung nodule, current smoker EXAM: CT CHEST WITH CONTRAST TECHNIQUE: Multidetector CT imaging of the chest was performed during intravenous contrast administration. CONTRAST:  22mL OMNIPAQUE IOHEXOL 300 MG/ML  SOLN COMPARISON:  PET-CT, 09/20/2016, CT-guided biopsy, 10/05/2016 FINDINGS: Cardiovascular: Aortic atherosclerosis.  Normal heart size. No pericardial effusion. Mediastinum/Nodes: No enlarged mediastinal, hilar, or axillary lymph nodes. Small hiatal hernia. Thyroid gland, trachea, and esophagus demonstrate no significant findings. Lungs/Pleura: Mild centrilobular emphysema. Significant interval enlargement of a previously seen small nodule in the peripheral left upper lobe on examination dated 2017, now a spiculated mass measuring 3.8 x 1.9 cm (series 3, image 50). There is a small, irregular residual in the posterior right upper lobe at the site of a previously noted FDG avid mass, measuring 0.7 x 0.7 cm, which was biopsied with benign pathology (series 3, image 55). No pleural effusion or pneumothorax. Upper Abdomen: No acute abnormality. Musculoskeletal: No chest wall mass or suspicious bone lesions identified. IMPRESSION: 1. Significant interval enlargement of a previously seen small nodule in the peripheral left upper lobe on examination dated 2017, now a spiculated subpleural mass measuring 3.8 x 1.9 cm. This is highly concerning for primary lung malignancy. Consider metabolic characterization by PET-CT and tissue sampling. 2. There is a small, irregular residual in the posterior right  upper lobe at the site of a previously noted FDG avid mass, which was biopsied with benign pathology. 3. Emphysema. These results will be called to the ordering clinician or representative by the Radiologist Assistant, and communication documented in the PACS or [employer]. Aortic Atherosclerosis (ICD10-I70.0) and Emphysema (ICD10-J43.9). Electronically Signed   By: Jearld Lesch M.D.   On: 07/26/2021 15:33    ASSESSMENT: Left upper lobe lung nodule.  PLAN:    Left upper lobe lung nodule: Nodules noted on PET scan on September 20, 2016 at that time it was 7 mm and had low-grade metabolic activity.  Repeat imaging on July 26, 2021 reviewed independently and report as above which showed significant increase in size of nodule now measuring 3.8 x 1.9 cm.  We will get a PET scan to further evaluate.  Patient then will require a CT-guided biopsy to confirm the diagnosis.  Return to clinic 1 week after her biopsy to discuss the results and treatment planning. History of right upper lobe lung nodule: FDG avid nodule was biopsied on October 05, 2016 and was negative for malignancy.  I spent a total of 60 minutes reviewing chart data, face-to-face evaluation with the patient, counseling and coordination of care as detailed above.   Patient expressed understanding and was in agreement with this plan. She also understands that She can call clinic at any time with any questions, concerns, or complaints.   Cancer Staging No matching staging information was found for the patient.  Jeralyn Ruths, MD   08/04/2021 1:54 PM

## 2021-08-04 NOTE — Progress Notes (Signed)
Met with patient after new patient visit with Dr. Grayland Ormond to discuss recent CT scan. Reviewed with patient that will work on getting her scheduled for PET scan, biopsy, and follow up with Dr. Grayland Ormond after the biopsy. Pt aware that will be called with her appts. Contact info given and instructed to call with any questions or needs. Pt verbalized understanding.

## 2021-08-17 ENCOUNTER — Other Ambulatory Visit: Payer: Self-pay

## 2021-08-17 ENCOUNTER — Ambulatory Visit
Admission: RE | Admit: 2021-08-17 | Discharge: 2021-08-17 | Disposition: A | Discharge status: Home / Self Care | Payer: Medicare Other | Source: Ambulatory Visit | Attending: Oncology | Admitting: Oncology

## 2021-08-17 DIAGNOSIS — I700 Atherosclerosis of aorta: Secondary | ICD-10-CM | POA: Insufficient documentation

## 2021-08-17 DIAGNOSIS — J439 Emphysema, unspecified: Secondary | ICD-10-CM | POA: Insufficient documentation

## 2021-08-17 DIAGNOSIS — R911 Solitary pulmonary nodule: Secondary | ICD-10-CM | POA: Diagnosis not present

## 2021-08-17 LAB — GLUCOSE, CAPILLARY: Glucose-Capillary: 95 mg/dL (ref 70–99)

## 2021-08-17 MED ORDER — FLUDEOXYGLUCOSE F - 18 (FDG) INJECTION
8.5000 | Freq: Once | INTRAVENOUS | Status: AC | PRN
  Administered 2021-08-17: 8.69 via INTRAVENOUS

## 2021-08-23 ENCOUNTER — Encounter: Payer: Self-pay | Admitting: *Deleted

## 2021-08-23 NOTE — Progress Notes (Signed)
Spoke with patient and reviewed upcoming appts for biopsy and follow up with Dr. Orlie Dakin. Offered new patient visit with Dr. Rushie Chestnut as well but pt declined and would like to discuss with Dr. Orlie Dakin about treatment options before scheduling further consults.   Appts mailed per pt request.

## 2021-08-23 NOTE — Progress Notes (Signed)
Message left with patient to call back to review PET scan results and inform that will need a biopsy of the left lung nodule.   Biopsy checklist faxed to scheduling and awaiting for biopsy to be scheduled. Pt will be notified with biopsy appt and follow up appt once scheduled.

## 2021-08-26 NOTE — Progress Notes (Signed)
Patient on schedule for Lung biopsy 08/30/2021, called and spoke with patient on phone with pre procedure instructions given. Made aware to be here at 0900, NPO after Mn prior to procedure as well as driver post procedure/recovery/discharge. Stated understanding.

## 2021-08-29 ENCOUNTER — Other Ambulatory Visit: Payer: Self-pay | Admitting: Radiology

## 2021-08-30 ENCOUNTER — Ambulatory Visit
Admission: RE | Admit: 2021-08-30 | Discharge: 2021-08-30 | Disposition: A | Discharge status: Home / Self Care | Payer: Medicare Other | Source: Ambulatory Visit | Attending: Oncology | Admitting: Oncology

## 2021-08-30 ENCOUNTER — Ambulatory Visit
Admission: RE | Admit: 2021-08-30 | Discharge: 2021-08-30 | Disposition: A | Discharge status: Home / Self Care | Payer: Medicare Other | Source: Ambulatory Visit | Attending: Interventional Radiology | Admitting: Interventional Radiology

## 2021-08-30 ENCOUNTER — Other Ambulatory Visit: Payer: Self-pay

## 2021-08-30 DIAGNOSIS — R911 Solitary pulmonary nodule: Secondary | ICD-10-CM | POA: Insufficient documentation

## 2021-08-30 DIAGNOSIS — R918 Other nonspecific abnormal finding of lung field: Secondary | ICD-10-CM | POA: Insufficient documentation

## 2021-08-30 DIAGNOSIS — Z9889 Other specified postprocedural states: Secondary | ICD-10-CM

## 2021-08-30 MED ORDER — FENTANYL CITRATE (PF) 100 MCG/2ML IJ SOLN
INTRAMUSCULAR | Status: AC | PRN
  Administered 2021-08-30 (×2): 25 ug via INTRAVENOUS

## 2021-08-30 MED ORDER — FENTANYL CITRATE (PF) 100 MCG/2ML IJ SOLN
INTRAMUSCULAR | Status: AC
  Filled 2021-08-30: qty 2

## 2021-08-30 MED ORDER — MIDAZOLAM HCL 2 MG/2ML IJ SOLN
INTRAMUSCULAR | Status: AC | PRN
  Administered 2021-08-30 (×2): .5 mg via INTRAVENOUS

## 2021-08-30 MED ORDER — SODIUM CHLORIDE 0.9 % IV SOLN: INTRAVENOUS | Status: DC

## 2021-08-30 MED ORDER — MIDAZOLAM HCL 2 MG/2ML IJ SOLN
INTRAMUSCULAR | Status: AC
  Filled 2021-08-30: qty 4

## 2021-08-30 NOTE — Progress Notes (Signed)
Chest x-ray resulted. Pt. Asymptomatic post lung biopsy. Site without any complications. Pt. Stable for DC home.

## 2021-08-30 NOTE — Procedures (Signed)
Interventional Radiology Procedure Note  Procedure: CT guided biopsy of LUL pleural based lung mass. Complications: No immediate Recommendations: - Bedrest until CXR cleared.  Minimize talking, coughing or otherwise straining.  - Follow up 2 hr CXR pending   Signed,  Sterling Big, MD

## 2021-08-30 NOTE — H&P (Signed)
Chief Complaint: Patient was seen in consultation today for left upper lobe lung nodule at the request of Finnegan,Timothy J  Referring Physician(s): Finnegan,Timothy J  Supervising Physician: Malachy Moan  Patient Status: [facility] - Out-pt  History of Present Illness: Monica Jordan is a 85 y.o. female with a left upper lobe lung nodule that was seen with imaging in 2017. Recent CT and PET imaging in 07/2021 findings of LUL nodule increased in size and PET shows nodule to be hypermetabolic.  The patient has been seen by Oncology, Dr. Orlie Dakin on 08/04/2021 and request received for image guided biopsy of LUL lung nodule. Patient is also known to our service s/p RUL lung mass hypermetabolic on PET biopsy in 2018, pathology showed no evidence of malignancy.    The patient has had a H&P performed within the last 30 days, all history, medications, and exam have been reviewed. The patient denies any interval changes since the H&P.  The patient denies any current chest pain, shortness of breath or palpitations. She denies any known bleeding or clotting disorder and denies any recent use of blood thinners. The patient denies any recent fever or chills. The patient denies any history of sleep apnea or chronic oxygen use. She has no known complications to sedation.    Past Medical History:  Diagnosis Date   Anxiety    Arthritis    Bronchitis    COPD (chronic obstructive pulmonary disease) (HCC)    Depression    Dizziness    1 month   Dysrhythmia    Environmental and seasonal allergies    HOH (hard of hearing)    left ear   Hyperlipidemia    Hypertension    Sciatica    Vertigo     Past Surgical History:  Procedure Laterality Date   CATARACT EXTRACTION W/PHACO Left 07/03/2019   Procedure: CATARACT EXTRACTION PHACO AND INTRAOCULAR LENS PLACEMENT (IOC) LEFT VISION BLUE;  Surgeon: Elliot Cousin, MD;  Location: [facility] ORS;  Service: Ophthalmology;  Laterality: Left;  Lot  #[id number] H [place]: 00:42.9 CDE: 7.54    CATARACT EXTRACTION W/PHACO Right 08/07/2019   Procedure: CATARACT EXTRACTION PHACO AND INTRAOCULAR LENS PLACEMENT (IOC) RIGHT VISION BLUE;  Surgeon: Elliot Cousin, MD;  Location: [facility] ORS;  Service: Ophthalmology;  Laterality: Right;  Lot #[id number] H [place]: 01:00.0 CDE: 9.40    COLONOSCOPY      Allergies: Patient has no known allergies.  Medications: Prior to Admission medications   Medication Sig Start Date End Date Taking? Authorizing Provider  albuterol (VENTOLIN HFA) 108 (90 Base) MCG/ACT inhaler Inhale into the lungs as needed for wheezing or shortness of breath. Takes in the morning if needed   Yes [provider]  ALPRAZolam (XANAX) 0.25 MG tablet Take 0.25 mg by mouth 2 (two) times daily.   Yes [provider]  amLODipine (NORVASC) 10 MG tablet Take 1 tablet by mouth daily. 05/12/21  Yes [provider]  buPROPion (WELLBUTRIN XL) 150 MG 24 hr tablet Take 150 mg by mouth daily. 03/03/21  Yes [provider]  metoprolol tartrate (LOPRESSOR) 25 MG tablet Take 25 mg by mouth daily. Takes in the morning   Yes [provider]  rosuvastatin (CRESTOR) 5 MG tablet Take by mouth. 03/03/21 03/03/22 Yes [provider]  traMADol Janean Sark) 50 MG tablet Take by mouth. 07/05/21 10/03/21 Yes [provider]  amLODipine (NORVASC) 5 MG tablet Take 1 tablet (5 mg total) by mouth daily. Patient not taking: Reported on 08/04/2021 03/15/18   Chiquita Loth  J, MD  aspirin EC 81 MG tablet Take 81 mg by mouth daily.    [provider]  atorvastatin (LIPITOR) 20 MG tablet Take 20 mg by mouth daily. Takes in the morning Patient not taking: Reported on 08/04/2021 08/29/16   [provider]  citalopram (CELEXA) 10 MG tablet Take 10 mg by mouth daily.    [provider]  clobetasol ointment (TEMOVATE) 0.05 % USE 1 FINGERTIP NIGHTLY FOR 4 WEEKS. 07/29/21   [provider]  conjugated estrogens  (PREMARIN) vaginal cream Place vaginally. 07/31/19   [provider]  meloxicam (MOBIC) 7.5 MG tablet Take 7.5 mg by mouth daily. In the morning    [provider]  metoprolol succinate (TOPROL-XL) 25 MG 24 hr tablet Take 25 mg by mouth daily. Patient not taking: Reported on 08/04/2021 05/13/21   [provider]  triamterene-hydrochlorothiazide (MAXZIDE-25) 37.5-25 MG tablet Take 1 tablet by mouth daily. Patient not taking: Reported on 08/04/2021    [provider]     Family History  Problem Relation Age of Onset   Breast cancer Sister    Prostate cancer Brother    Lung cancer Brother    Healthy Mother    Healthy Father    Lung cancer Brother     Social History   Socioeconomic History   Marital status: Single    Spouse name: Not on file   Number of children: Not on file   Years of education: Not on file   Highest education level: Not on file  Occupational History   Not on file  Tobacco Use   Smoking status: Every Day    Packs/day: 0.50    Years: 30.00    Pack years: 15.00    Types: Cigarettes   Smokeless tobacco: Never  Vaping Use   Vaping Use: Never used  Substance and Sexual Activity   Alcohol use: No   Drug use: No   Sexual activity: Not on file  Other Topics Concern   Not on file  Social History Narrative   Not on file   Social Determinants of Health   Financial Resource Strain: Not on file  Food Insecurity: Not on file  Transportation Needs: Not on file  Physical Activity: Not on file  Stress: Not on file  Social Connections: Not on file    Review of Systems: A 12 point ROS discussed and pertinent positives are indicated in the HPI above.  All other systems are negative.  Review of Systems  Vital Signs: BP (!) 149/69   Pulse (!) 52   Temp 98.5 F (36.9 C) (Oral)   Resp 18   Ht 5\' 2"  (1.575 m)   Wt 161 lb (73 kg)   SpO2 97%   BMI 29.45 kg/m   Physical Exam Constitutional:      Appearance: Normal  appearance.  HENT:     Head: Normocephalic and atraumatic.  Cardiovascular:     Rate and Rhythm: Regular rhythm. Bradycardia present.  Pulmonary:     Effort: Pulmonary effort is normal. No respiratory distress.     Breath sounds: Normal breath sounds.  Skin:    General: Skin is warm and dry.  Neurological:     Mental Status: She is alert and oriented to person, place, and time.  Psychiatric:        Mood and Affect: Mood normal.        Behavior: Behavior normal.        Thought Content: Thought content normal.  Imaging: NM PET Image Initial (PI) Skull Base To Thigh  Result Date: 08/19/2021 CLINICAL DATA:  Follow-up treatment strategy for pulmonary nodule. EXAM: NUCLEAR MEDICINE PET SKULL BASE TO THIGH TECHNIQUE: 8.7 mCi F-18 FDG was injected intravenously. Full-ring PET imaging was performed from the skull base to thigh after the radiotracer. CT data was obtained and used for attenuation correction and anatomic localization. Fasting blood glucose: 95 mg/dl COMPARISON:  CT chest dated July 26, 2021; PET-CT dated September 20, 2016 FINDINGS: Mediastinal blood pool activity: SUV max 2.4 Liver activity: SUV max 3.3 NECK: No hypermetabolic lymph nodes in the neck. Incidental CT findings: none CHEST: Hypermetabolic juxtapleural mass of the left upper lobe measuring approximately 3.4 x 1.8 cm (unchanged compared to prior exam when remeasured in similar plane) with an SUV max of 5.5. No hypermetabolic mediastinal or hilar nodes. Additional small solid right middle lobe juxtapleural solid nodule measuring 6 mm on series 3, image 103, unchanged compared to most recent prior but new compared to 2017 prior. Stable right upper lobe solid pulmonary nodule measuring approximately 7 mm on image 78. Incidental CT findings: Centrilobular emphysema. Atherosclerotic disease of the thoracic aorta. ABDOMEN/PELVIS: No abnormal hypermetabolic activity within the liver, pancreas, adrenal glands, or spleen. No  hypermetabolic lymph nodes in the abdomen or pelvis. Incidental CT findings: Atherosclerotic disease of the abdominal aorta. Diverticulosis. SKELETON: No focal hypermetabolic activity to suggest skeletal metastasis. Incidental CT findings: none IMPRESSION: 1. Hypermetabolic juxtapleural left upper lobe mass, concerning for primary lung malignancy. 2. Nonspecific 6 mm solid nodule of the right middle lobe, unchanged compared to most recent prior exam, but new compared to 2017 prior. Too small to assess for FDG avidity. Recommend attention on follow-up. 3. No evidence of FDG avid metastatic disease in the chest, abdomen or pelvis. 4. Aortic Atherosclerosis (ICD10-I70.0) and Emphysema (ICD10-J43.9). Electronically Signed   By: Allegra Lai M.D.   On: 08/19/2021 11:38    Labs:  CBC: No results for input(s): WBC, HGB, HCT, PLT in the last 8760 hours.  COAGS: No results for input(s): INR, APTT in the last 8760 hours.  BMP: Recent Labs    07/26/21 0950  CREATININE 1.30*    Assessment and Plan: 85 year old female with a left upper lobe lung nodule that was seen with imaging in 2017. CT and PET 07/2021 findings of LUL nodule increased in size and PET shows nodule to be hypermetabolic.  The patient has been seen by Oncology, Dr. Orlie Dakin on 08/04/2021 and request received for image guided biopsy of LUL lung nodule. Patient is also known to our service s/p RUL lung mass hypermetabolic on PET biopsy in 2018, pathology showed no evidence of malignancy.    The patient has been NPO, no blood thinners taken, imaging, labs and vitals have been reviewed.  Risks and benefits of CT guided lung nodule biopsy was discussed with the patient including, but not limited to bleeding, hemoptysis, respiratory failure requiring intubation, infection, pneumothorax requiring chest tube placement.  All of the patient's questions were answered and the patient is agreeable to proceed.  Consent signed and in  chart.   Thank you for this interesting consult.  I greatly enjoyed meeting Monica Jordan and look forward to participating in their care.  A copy of this report was sent to the requesting provider on this date.  Electronically Signed: Berneta Levins, PA-C 08/30/2021, 9:58 AM   I spent a total of 15 Minutes in face to face in clinical consultation, greater than 50%  of which was counseling/coordinating care for left upper lobe lung nodule.

## 2021-08-31 ENCOUNTER — Inpatient Hospital Stay: Payer: Medicare Other

## 2021-09-01 LAB — SURGICAL PATHOLOGY

## 2021-09-06 ENCOUNTER — Telehealth: Payer: Self-pay | Admitting: Oncology

## 2021-09-06 NOTE — Telephone Encounter (Signed)
Pt calledand wants to know if she can change the time of her appt on 12-15. Call back at [phone]

## 2021-09-08 ENCOUNTER — Encounter: Payer: Self-pay | Admitting: *Deleted

## 2021-09-08 ENCOUNTER — Inpatient Hospital Stay: Payer: Medicare Other | Attending: Oncology | Admitting: Oncology

## 2021-09-08 ENCOUNTER — Encounter: Payer: Self-pay | Admitting: Oncology

## 2021-09-08 ENCOUNTER — Other Ambulatory Visit: Payer: Self-pay

## 2021-09-08 VITALS — BP 146/67 | HR 50 | Temp 98.2°F | Ht 62.0 in | Wt 163.6 lb

## 2021-09-08 DIAGNOSIS — R911 Solitary pulmonary nodule: Secondary | ICD-10-CM | POA: Diagnosis not present

## 2021-09-08 DIAGNOSIS — F17210 Nicotine dependence, cigarettes, uncomplicated: Secondary | ICD-10-CM | POA: Diagnosis not present

## 2021-09-08 NOTE — Progress Notes (Signed)
[facility]  [phone] [phone]  ID: Monica Jordan OB: Jun 29, 1936  MR#: [id number]  MWU#:[id number]  Patient Care Team: Barbette Reichmann, MD as PCP - General (Internal Medicine) Glory Buff, RN as Oncology Nurse Navigator  CHIEF COMPLAINT: Left upper lobe lung nodule  INTERVAL HISTORY: Patient returns to clinic today for further evaluation and discussion of her biopsy results.  She continues to feel well and remains asymptomatic.  She has no neurologic complaints.  She denies any recent fevers or illnesses.  She has a good appetite and denies weight loss.  She has no chest pain, shortness of breath, cough, or hemoptysis.  She denies any nausea, vomiting, constipation, or diarrhea.  She has no urinary complaints.  Patient offers no specific complaints today.  REVIEW OF SYSTEMS:   Review of Systems  Constitutional: Negative.  Negative for fever, malaise/fatigue and weight loss.  Respiratory: Negative.  Negative for cough, hemoptysis and shortness of breath.   Cardiovascular: Negative.  Negative for chest pain and leg swelling.  Gastrointestinal: Negative.  Negative for abdominal pain.  Genitourinary: Negative.  Negative for dysuria.  Musculoskeletal: Negative.  Negative for back pain.  Skin: Negative.  Negative for rash.  Neurological: Negative.  Negative for dizziness, focal weakness, weakness and headaches.  Psychiatric/Behavioral: Negative.  The patient is not nervous/anxious.    As per HPI. Otherwise, a complete review of systems is negative.  PAST MEDICAL HISTORY: Past Medical History:  Diagnosis Date   Anxiety    Arthritis    Bronchitis    COPD (chronic obstructive pulmonary disease) (HCC)    Depression    Dizziness    1 month   Dysrhythmia    Environmental and seasonal allergies    HOH (hard of hearing)    left ear   Hyperlipidemia    Hypertension    Sciatica    Vertigo     PAST SURGICAL HISTORY: Past Surgical  History:  Procedure Laterality Date   CATARACT EXTRACTION W/PHACO Left 07/03/2019   Procedure: CATARACT EXTRACTION PHACO AND INTRAOCULAR LENS PLACEMENT (IOC) LEFT VISION BLUE;  Surgeon: Elliot Cousin, MD;  Location: [facility] ORS;  Service: Ophthalmology;  Laterality: Left;  Lot #[id number] H [place]: 00:42.9 CDE: 7.54    CATARACT EXTRACTION W/PHACO Right 08/07/2019   Procedure: CATARACT EXTRACTION PHACO AND INTRAOCULAR LENS PLACEMENT (IOC) RIGHT VISION BLUE;  Surgeon: Elliot Cousin, MD;  Location: [facility] ORS;  Service: Ophthalmology;  Laterality: Right;  Lot #[id number] H [place]: 01:00.0 CDE: 9.40    COLONOSCOPY      FAMILY HISTORY: Family History  Problem Relation Age of Onset   Breast cancer Sister    Prostate cancer Brother    Lung cancer Brother    Healthy Mother    Healthy Father    Lung cancer Brother     ADVANCED DIRECTIVES (Y/N):  N  HEALTH MAINTENANCE: Social History   Tobacco Use   Smoking status: Every Day    Packs/day: 0.50    Years: 30.00    Pack years: 15.00    Types: Cigarettes   Smokeless tobacco: Never  Vaping Use   Vaping Use: Never used  Substance Use Topics   Alcohol use: No   Drug use: No     Colonoscopy:  PAP:  Bone density:  Lipid panel:  No Known Allergies  Current Outpatient Medications  Medication Sig Dispense Refill   albuterol (VENTOLIN HFA) 108 (90 Base) MCG/ACT inhaler Inhale into the lungs as needed for wheezing or shortness of breath. Takes in the morning  if needed     ALPRAZolam (XANAX) 0.25 MG tablet Take 0.25 mg by mouth 2 (two) times daily.     amLODipine (NORVASC) 10 MG tablet Take 1 tablet by mouth daily.     amLODipine (NORVASC) 5 MG tablet Take 1 tablet (5 mg total) by mouth daily. 30 tablet 0   aspirin EC 81 MG tablet Take 81 mg by mouth daily.     atorvastatin (LIPITOR) 20 MG tablet Take 20 mg by mouth daily. Takes in the morning     buPROPion (WELLBUTRIN XL) 150 MG 24 hr tablet Take 150 mg by mouth daily.     clobetasol ointment  (TEMOVATE) 0.05 % USE 1 FINGERTIP NIGHTLY FOR 4 WEEKS.     metoprolol succinate (TOPROL-XL) 25 MG 24 hr tablet Take 25 mg by mouth daily.     metoprolol tartrate (LOPRESSOR) 25 MG tablet Take 25 mg by mouth daily. Takes in the morning     rosuvastatin (CRESTOR) 5 MG tablet Take by mouth.     traMADol (ULTRAM) 50 MG tablet Take by mouth.     No current facility-administered medications for this visit.    OBJECTIVE: Vitals:   09/08/21 1007  BP: (!) 146/67  Pulse: (!) 50  Temp: 98.2 F (36.8 C)  SpO2: 99%     Body mass index is 29.92 kg/m.    ECOG FS:0 - Asymptomatic  General: Well-developed, well-nourished, no acute distress. Eyes: Pink conjunctiva, anicteric sclera. HEENT: Normocephalic, moist mucous membranes. Lungs: No audible wheezing or coughing. Heart: Regular rate and rhythm. Abdomen: Soft, nontender, no obvious distention. Musculoskeletal: No edema, cyanosis, or clubbing. Neuro: Alert, answering all questions appropriately. Cranial nerves grossly intact. Skin: No rashes or petechiae noted. Psych: Normal affect.  LAB RESULTS:  Lab Results  Component Value Date   NA 136 09/22/2016   K 4.2 09/22/2016   CL 105 09/22/2016   CO2 24 09/22/2016   GLUCOSE 86 09/22/2016   BUN 22 (H) 09/22/2016   CREATININE 1.30 (H) 07/26/2021   CALCIUM 9.2 09/22/2016   PROT 7.6 09/22/2016   ALBUMIN 4.1 09/22/2016   AST 19 09/22/2016   ALT 17 09/22/2016   ALKPHOS 69 09/22/2016   BILITOT 0.4 09/22/2016   GFRNONAA 43 (L) 09/22/2016   GFRAA 50 (L) 09/22/2016    Lab Results  Component Value Date   WBC 9.7 10/05/2016   NEUTROABS 6.8 (H) 09/22/2016   HGB 13.7 10/05/2016   HCT 40.9 10/05/2016   MCV 83.6 10/05/2016   PLT 263 10/05/2016     STUDIES: NM PET Image Initial (PI) Skull Base To Thigh  Result Date: 08/19/2021 CLINICAL DATA:  Follow-up treatment strategy for pulmonary nodule. EXAM: NUCLEAR MEDICINE PET SKULL BASE TO THIGH TECHNIQUE: 8.7 mCi F-18 FDG was injected  intravenously. Full-ring PET imaging was performed from the skull base to thigh after the radiotracer. CT data was obtained and used for attenuation correction and anatomic localization. Fasting blood glucose: 95 mg/dl COMPARISON:  CT chest dated July 26, 2021; PET-CT dated September 20, 2016 FINDINGS: Mediastinal blood pool activity: SUV max 2.4 Liver activity: SUV max 3.3 NECK: No hypermetabolic lymph nodes in the neck. Incidental CT findings: none CHEST: Hypermetabolic juxtapleural mass of the left upper lobe measuring approximately 3.4 x 1.8 cm (unchanged compared to prior exam when remeasured in similar plane) with an SUV max of 5.5. No hypermetabolic mediastinal or hilar nodes. Additional small solid right middle lobe juxtapleural solid nodule measuring 6 mm on series 3, image 103, unchanged  compared to most recent prior but new compared to 2017 prior. Stable right upper lobe solid pulmonary nodule measuring approximately 7 mm on image 78. Incidental CT findings: Centrilobular emphysema. Atherosclerotic disease of the thoracic aorta. ABDOMEN/PELVIS: No abnormal hypermetabolic activity within the liver, pancreas, adrenal glands, or spleen. No hypermetabolic lymph nodes in the abdomen or pelvis. Incidental CT findings: Atherosclerotic disease of the abdominal aorta. Diverticulosis. SKELETON: No focal hypermetabolic activity to suggest skeletal metastasis. Incidental CT findings: none IMPRESSION: 1. Hypermetabolic juxtapleural left upper lobe mass, concerning for primary lung malignancy. 2. Nonspecific 6 mm solid nodule of the right middle lobe, unchanged compared to most recent prior exam, but new compared to 2017 prior. Too small to assess for FDG avidity. Recommend attention on follow-up. 3. No evidence of FDG avid metastatic disease in the chest, abdomen or pelvis. 4. Aortic Atherosclerosis (ICD10-I70.0) and Emphysema (ICD10-J43.9). Electronically Signed   By: Allegra Lai M.D.   On: 08/19/2021 11:38    DG Chest Port 1 View  Result Date: 08/30/2021 CLINICAL DATA:  Status post CT guided biopsy EXAM: PORTABLE CHEST 1 VIEW COMPARISON:  PET-CT, 08/17/2021, chest radiograph, 10/05/2016 FINDINGS: Cardiomegaly. Subpleural nodular opacity of the peripheral left upper lobe as seen on prior PET-CT. No pneumothorax. The visualized skeletal structures are unremarkable. IMPRESSION: 1. No pneumothorax or other acute abnormality of the lungs. 2. Subpleural nodular opacity of the peripheral left upper lobe as seen on prior PET-CT, biopsied percutaneously today. Electronically Signed   By: Jearld Lesch M.D.   On: 08/30/2021 12:40   CT LUNG MASS BIOPSY  Result Date: 08/30/2021 INDICATION: 85 year old female with an enlarging pleural based left upper lobe pulmonary mass which demonstrates low-grade FDG activity in is concerning for a primary bronchogenic neoplasm. She presents for CT-guided biopsy of the same. EXAM: CT-guided biopsy left upper lobe lobe pulmonary nodule Interventional Radiologist:  Sterling Big, MD MEDICATIONS: None. ANESTHESIA/SEDATION: Fentanyl 50 mcg IV; Versed 1 mg IV Moderate Sedation Time:  14 minutes The patient was continuously monitored during the procedure by the interventional radiology nurse under my direct supervision. FLUOROSCOPY TIME:  None. COMPLICATIONS: None immediate. Estimated blood loss:  0 PROCEDURE: Informed written consent was obtained from the patient after a thorough discussion of the procedural risks, benefits and alternatives. All questions were addressed. Maximal Sterile Barrier Technique was utilized including caps, mask, sterile gowns, sterile gloves, sterile drape, hand hygiene and skin antiseptic. A timeout was performed prior to the initiation of the procedure. A planning axial CT scan was performed. The nodule in the periphery of the left upper lobe was successfully identified. A suitable skin entry site was selected and marked. The region was then sterilely  prepped and draped in standard fashion using Betadine skin prep. Local anesthesia was attained by infiltration with 1% lidocaine. A small dermatotomy was made. Under intermittent CT fluoroscopic guidance, a 17 gauge trocar needle was advanced into the lung and positioned at the margin of the nodule. Multiple 18 gauge core biopsies were then coaxially obtained using the BioPince automated biopsy device. Biopsy specimens were placed in formalin and delivered to pathology for further analysis. The biopsy device and introducer needle were removed. Post biopsy axial CT imaging demonstrates no evidence of immediate complication. There is no pneumothorax. Mild perilesional alveolar hemorrhage is not unexpected. The patient tolerated the procedure well. IMPRESSION: Technically successful CT-guided biopsy left upper lobe pulmonary nodule. Electronically Signed   By: Malachy Moan M.D.   On: 08/30/2021 11:49    ASSESSMENT: Left upper  lobe lung nodule.  PLAN:    Left upper lobe lung nodule: Nodule noted on PET scan on September 20, 2016 at that time it was 7 mm and had low-grade metabolic activity.  Repeat imaging on July 26, 2021 reviewed independently and report as above which showed significant increase in size of nodule now measuring 3.8 x 1.9 cm.  Repeat PET scan on August 17, 2021 reviewed independently and report as above with hypermetabolism of known nodule, but no hypermetabolic mediastinal or hilar nodes.  Subsequent biopsy was negative for malignancy.  No intervention is needed.  Because lesion continues to slowly grow, will get a CT scan in 3 months to assess for interval change.   History of right upper lobe lung nodule: FDG avid nodule was biopsied on October 05, 2016 and was negative for malignancy.  I spent a total of 20 minutes reviewing chart data, face-to-face evaluation with the patient, counseling and coordination of care as detailed above.    Patient expressed understanding and was in  agreement with this plan. She also understands that She can call clinic at any time with any questions, concerns, or complaints.    Cancer Staging  No matching staging information was found for the patient.  Jeralyn Ruths, MD   09/08/2021 5:06 PM

## 2021-09-08 NOTE — Progress Notes (Signed)
Met with patient during follow up visit with Dr. Grayland Ormond to review biopsy results. All questions answered during visit. Reviewed upcoming appts. Instructed to call with any questions or needs. Pt verbalized understanding.

## 2021-09-14 ENCOUNTER — Inpatient Hospital Stay: Payer: Medicare Other | Admitting: Hospice and Palliative Medicine

## 2021-09-14 DIAGNOSIS — R918 Other nonspecific abnormal finding of lung field: Secondary | ICD-10-CM

## 2021-09-14 NOTE — Progress Notes (Signed)
Multidisciplinary Oncology Council Documentation  Monica Jordan was presented by our [facility] on 09/14/2021, which included representatives from:  Palliative Care Dietitian  Physical/Occupational Therapist Nurse Navigator Genetics Speech Therapist Social work Survivorship RN Financial Navigator Research RN   Monica Jordan currently presents with history of lung mass.  We reviewed previous medical and familial history, history of present illness, and recent lab results along with all available histopathologic and imaging studies. The MOC considered available treatment options and made the following recommendations/referrals:  -no further recommendations at this time.  The MOC is a meeting of clinicians from various specialty areas who evaluate and discuss patients for whom a multidisciplinary approach is being considered. Final determinations in the plan of care are those of the provider(s).   Today's extended care, comprehensive team conference, Monica Jordan was not present for the discussion and was not examined.

## 2021-12-02 NOTE — Progress Notes (Deleted)
?[facility]  ?[phone] [phone] ? ?ID: Monica Jordan OB: 1936-09-22  MR#: [id number]  KZS#:[id number] ? ?Patient Care Team: ?Barbette Reichmann, MD as PCP - General (Internal Medicine) ?Glory Buff, RN as Agricultural consultant ? ?CHIEF COMPLAINT: Left upper lobe lung nodule ? ?INTERVAL HISTORY: Patient returns to clinic today for further evaluation and discussion of her biopsy results.  She continues to feel well and remains asymptomatic.  She has no neurologic complaints.  She denies any recent fevers or illnesses.  She has a good appetite and denies weight loss.  She has no chest pain, shortness of breath, cough, or hemoptysis.  She denies any nausea, vomiting, constipation, or diarrhea.  She has no urinary complaints.  Patient offers no specific complaints today. ? ?REVIEW OF SYSTEMS:   ?Review of Systems  ?Constitutional: Negative.  Negative for fever, malaise/fatigue and weight loss.  ?Respiratory: Negative.  Negative for cough, hemoptysis and shortness of breath.   ?Cardiovascular: Negative.  Negative for chest pain and leg swelling.  ?Gastrointestinal: Negative.  Negative for abdominal pain.  ?Genitourinary: Negative.  Negative for dysuria.  ?Musculoskeletal: Negative.  Negative for back pain.  ?Skin: Negative.  Negative for rash.  ?Neurological: Negative.  Negative for dizziness, focal weakness, weakness and headaches.  ?Psychiatric/Behavioral: Negative.  The patient is not nervous/anxious.   ? ?As per HPI. Otherwise, a complete review of systems is negative. ? ?PAST MEDICAL HISTORY: ?Past Medical History:  ?Diagnosis Date  ? Anxiety   ? Arthritis   ? Bronchitis   ? COPD (chronic obstructive pulmonary disease) (HCC)   ? Depression   ? Dizziness   ? 1 month  ? Dysrhythmia   ? Environmental and seasonal allergies   ? HOH (hard of hearing)   ? left ear  ? Hyperlipidemia   ? Hypertension   ? Sciatica   ? Vertigo   ? ? ?PAST SURGICAL HISTORY: ?Past Surgical  History:  ?Procedure Laterality Date  ? CATARACT EXTRACTION W/PHACO Left 07/03/2019  ? Procedure: CATARACT EXTRACTION PHACO AND INTRAOCULAR LENS PLACEMENT (IOC) LEFT VISION BLUE;  Surgeon: Elliot Cousin, MD;  Location: [facility] ORS;  Service: Ophthalmology;  Laterality: Left;  Lot #[id number] ?[place]: 00:42.9 ?CDE: 7.54 ?  ? CATARACT EXTRACTION W/PHACO Right 08/07/2019  ? Procedure: CATARACT EXTRACTION PHACO AND INTRAOCULAR LENS PLACEMENT (IOC) RIGHT VISION BLUE;  Surgeon: Elliot Cousin, MD;  Location: [facility] ORS;  Service: Ophthalmology;  Laterality: Right;  Lot #[id number] ?[place]: 01:00.0 ?CDE: 9.40 ?  ? COLONOSCOPY    ? ? ?FAMILY HISTORY: ?Family History  ?Problem Relation Age of Onset  ? Breast cancer Sister   ? Prostate cancer Brother   ? Lung cancer Brother   ? Healthy Mother   ? Healthy Father   ? Lung cancer Brother   ? ? ?ADVANCED DIRECTIVES (Y/N):  N ? ?HEALTH MAINTENANCE: ?Social History  ? ?Tobacco Use  ? Smoking status: Every Day  ?  Packs/day: 0.50  ?  Years: 30.00  ?  Pack years: 15.00  ?  Types: Cigarettes  ? Smokeless tobacco: Never  ?Vaping Use  ? Vaping Use: Never used  ?Substance Use Topics  ? Alcohol use: No  ? Drug use: No  ? ? ? Colonoscopy: ? PAP: ? Bone density: ? Lipid panel: ? ?No Known Allergies ? ?Current Outpatient Medications  ?Medication Sig Dispense Refill  ? albuterol (VENTOLIN HFA) 108 (90 Base) MCG/ACT inhaler Inhale into the lungs as needed for wheezing or shortness of breath. Takes in the morning  if needed    ? ALPRAZolam (XANAX) 0.25 MG tablet Take 0.25 mg by mouth 2 (two) times daily.    ? amLODipine (NORVASC) 10 MG tablet Take 1 tablet by mouth daily.    ? amLODipine (NORVASC) 5 MG tablet Take 1 tablet (5 mg total) by mouth daily. 30 tablet 0  ? aspirin EC 81 MG tablet Take 81 mg by mouth daily.    ? atorvastatin (LIPITOR) 20 MG tablet Take 20 mg by mouth daily. Takes in the morning    ? buPROPion (WELLBUTRIN XL) 150 MG 24 hr tablet Take 150 mg by mouth daily.    ? clobetasol ointment  (TEMOVATE) 0.05 % USE 1 FINGERTIP NIGHTLY FOR 4 WEEKS.    ? metoprolol succinate (TOPROL-XL) 25 MG 24 hr tablet Take 25 mg by mouth daily.    ? metoprolol tartrate (LOPRESSOR) 25 MG tablet Take 25 mg by mouth daily. Takes in the morning    ? rosuvastatin (CRESTOR) 5 MG tablet Take by mouth.    ? ?No current facility-administered medications for this visit.  ? ? ?OBJECTIVE: ?There were no vitals filed for this visit. ?   There is no height or weight on file to calculate BMI.    ECOG FS:0 - Asymptomatic ? ?General: Well-developed, well-nourished, no acute distress. ?Eyes: Pink conjunctiva, anicteric sclera. ?HEENT: Normocephalic, moist mucous membranes. ?Lungs: No audible wheezing or coughing. ?Heart: Regular rate and rhythm. ?Abdomen: Soft, nontender, no obvious distention. ?Musculoskeletal: No edema, cyanosis, or clubbing. ?Neuro: Alert, answering all questions appropriately. Cranial nerves grossly intact. ?Skin: No rashes or petechiae noted. ?Psych: Normal affect. ? ?LAB RESULTS: ? ?Lab Results  ?Component Value Date  ? NA 136 09/22/2016  ? K 4.2 09/22/2016  ? CL 105 09/22/2016  ? CO2 24 09/22/2016  ? GLUCOSE 86 09/22/2016  ? BUN 22 (H) 09/22/2016  ? CREATININE 1.30 (H) 07/26/2021  ? CALCIUM 9.2 09/22/2016  ? PROT 7.6 09/22/2016  ? ALBUMIN 4.1 09/22/2016  ? AST 19 09/22/2016  ? ALT 17 09/22/2016  ? ALKPHOS 69 09/22/2016  ? BILITOT 0.4 09/22/2016  ? GFRNONAA 43 (L) 09/22/2016  ? GFRAA 50 (L) 09/22/2016  ? ? ?Lab Results  ?Component Value Date  ? WBC 9.7 10/05/2016  ? NEUTROABS 6.8 (H) 09/22/2016  ? HGB 13.7 10/05/2016  ? HCT 40.9 10/05/2016  ? MCV 83.6 10/05/2016  ? PLT 263 10/05/2016  ? ? ? ?STUDIES: ?No results found. ? ?ASSESSMENT: Left upper lobe lung nodule. ? ?PLAN:   ? ?Left upper lobe lung nodule: Nodule noted on PET scan on September 20, 2016 at that time it was 7 mm and had low-grade metabolic activity.  Repeat imaging on July 26, 2021 reviewed independently and report as above which showed significant  increase in size of nodule now measuring 3.8 x 1.9 cm.  Repeat PET scan on August 17, 2021 reviewed independently and report as above with hypermetabolism of known nodule, but no hypermetabolic mediastinal or hilar nodes.  Subsequent biopsy was negative for malignancy.  No intervention is needed.  Because lesion continues to slowly grow, will get a CT scan in 3 months to assess for interval change.   ?History of right upper lobe lung nodule: FDG avid nodule was biopsied on October 05, 2016 and was negative for malignancy. ? ?I spent a total of 20 minutes reviewing chart data, face-to-face evaluation with the patient, counseling and coordination of care as detailed above. ? ? ? ?Patient expressed understanding and was in agreement with  this plan. She also understands that She can call clinic at any time with any questions, concerns, or complaints.  ? ? Cancer Staging  ?No matching staging information was found for the patient. ? ?Jeralyn Ruths, MD   12/02/2021 9:41 AM ? ? ? ? ?

## 2021-12-07 ENCOUNTER — Ambulatory Visit: Payer: Medicare Other | Attending: Oncology

## 2021-12-08 ENCOUNTER — Inpatient Hospital Stay: Payer: Medicare Other | Admitting: Oncology

## 2021-12-08 DIAGNOSIS — R911 Solitary pulmonary nodule: Secondary | ICD-10-CM

## 2022-02-07 ENCOUNTER — Other Ambulatory Visit: Payer: Self-pay | Admitting: Internal Medicine

## 2022-02-07 DIAGNOSIS — Z1231 Encounter for screening mammogram for malignant neoplasm of breast: Secondary | ICD-10-CM

## 2022-07-28 IMAGING — CT CT BIOPY CORE LUNG/MEDIASTINUM
2 of 5 series · 13 of 32 positions shown, 18 images · non-contrast
Comparison: none

INDICATION: 85-year-old female with an enlarging pleural based left upper lobe
pulmonary mass which demonstrates low-grade FDG activity in is
concerning for a primary bronchogenic neoplasm. She presents for
CT-guided biopsy of the same.

[Series 2: i-spiral 5.0 br38 · axial · 0.80mm/px · z∈[+1301,+1382]mm · 8 of 31 slices shown, 13 images (1 of 2)]
[im 4/31  soft-tissue]
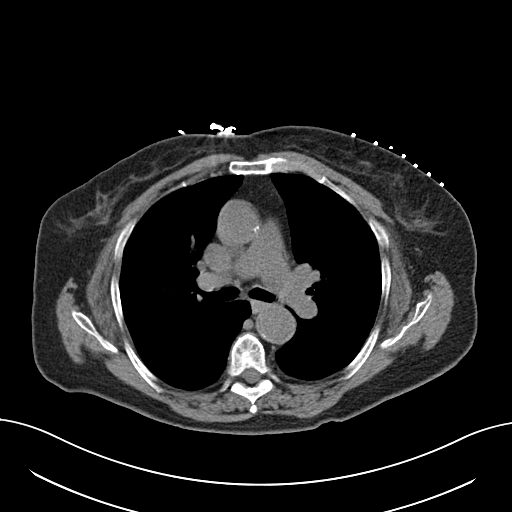
[im 4/31  bone]
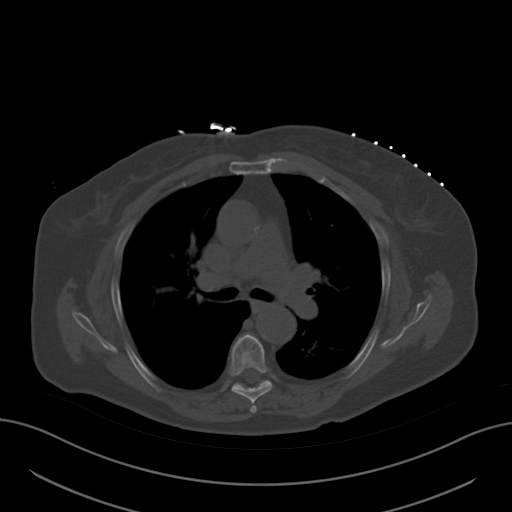
[im 7/31  soft-tissue]
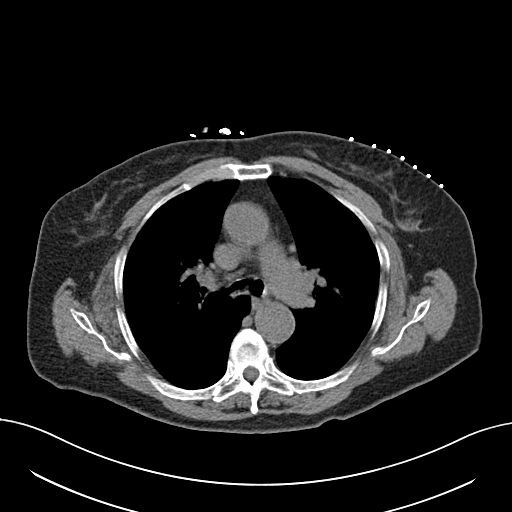
[im 11/31  soft-tissue]
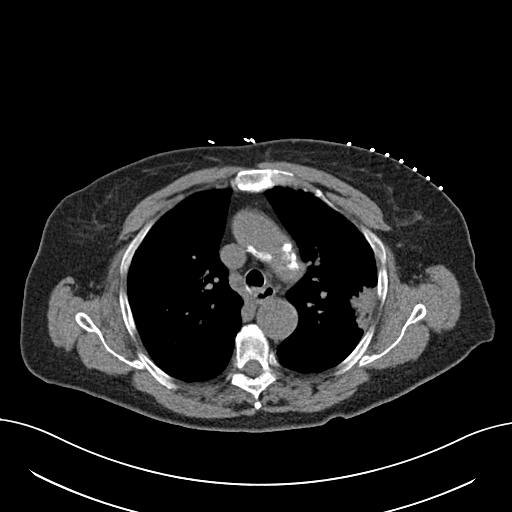
[im 14/31  soft-tissue]
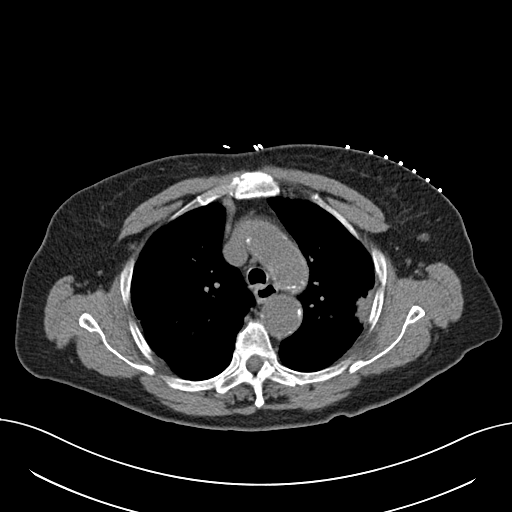
[im 17/31  soft-tissue]
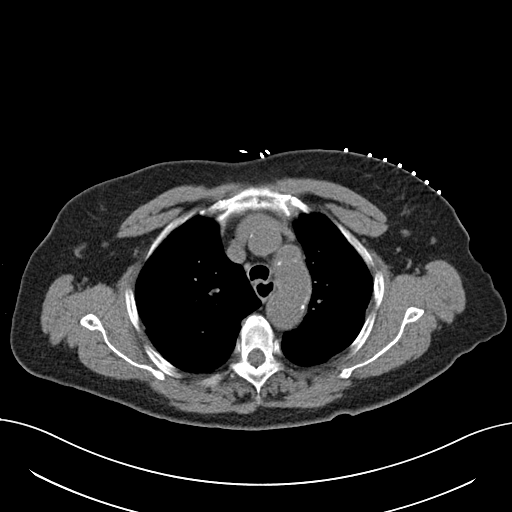
[im 17/31  lung]
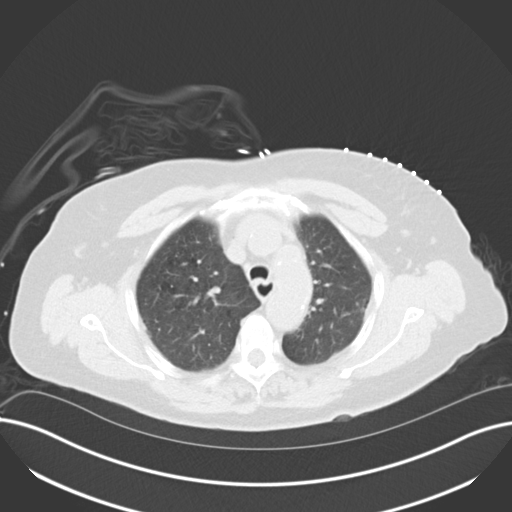
[im 21/31  soft-tissue]
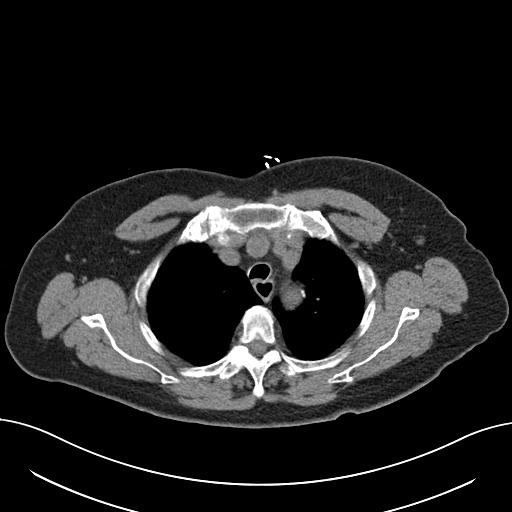
[im 21/31  lung]
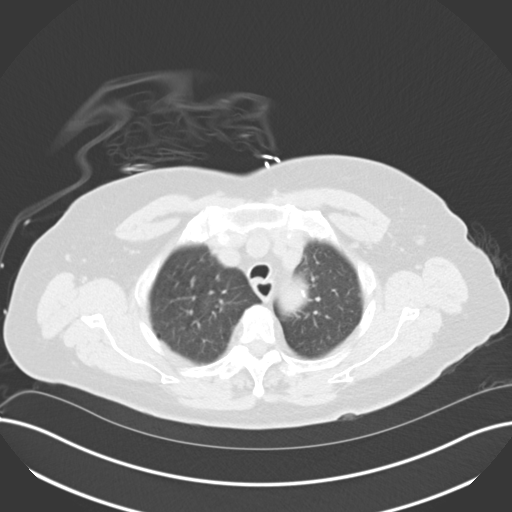
[im 24/31  soft-tissue]
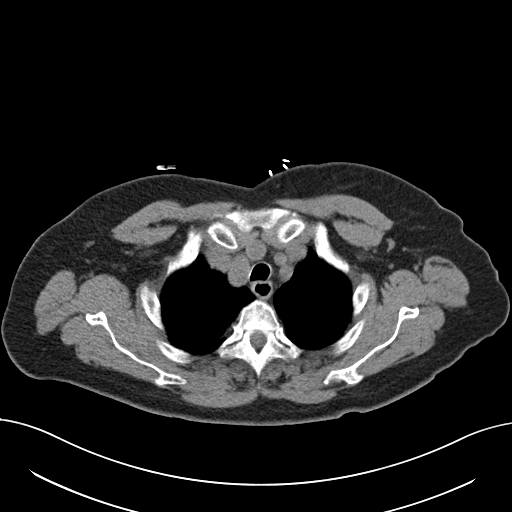
[im 24/31  lung]
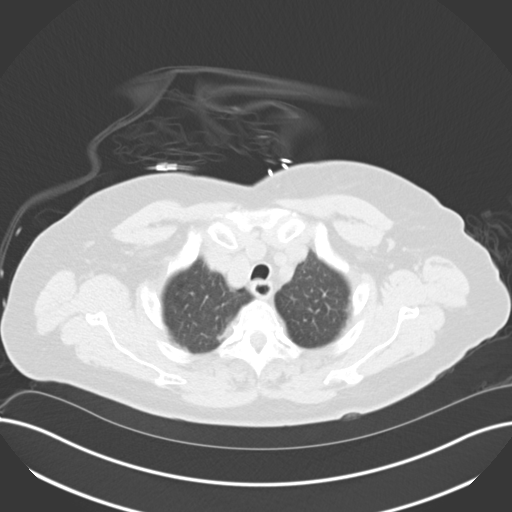
[im 27/31  soft-tissue]
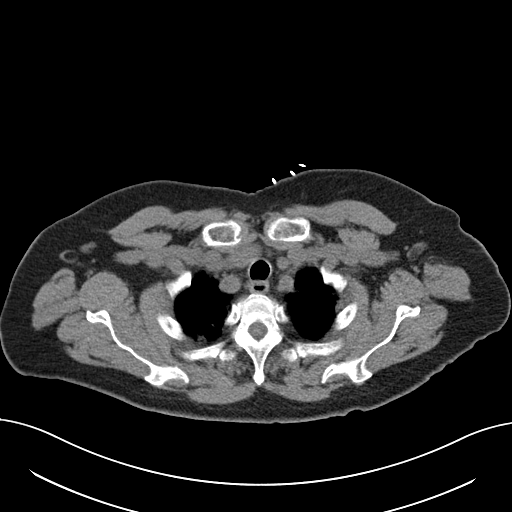
[im 27/31  lung]
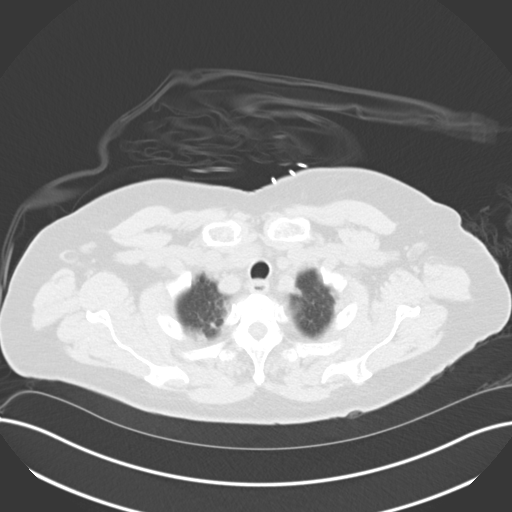

[Series 3: i-spiral 5.0 br38 · axial · 0.80mm/px · z∈[+1301,+1354]mm · 5 of 31 slices shown (2 of 2)]
[im 4/31  soft-tissue]
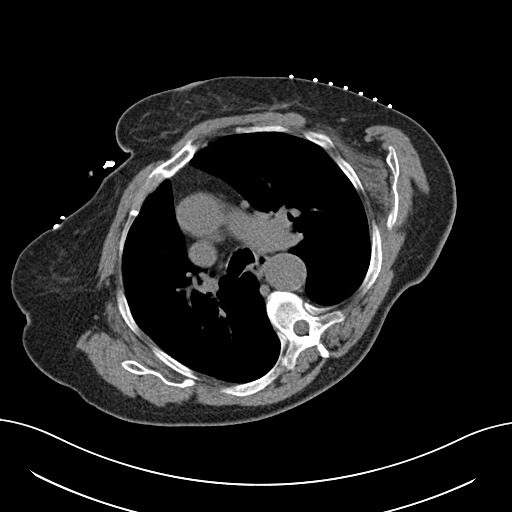
[im 8/31  soft-tissue]
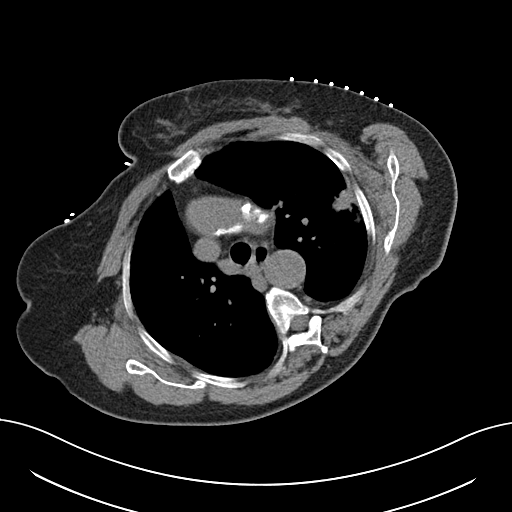
[im 12/31  soft-tissue]
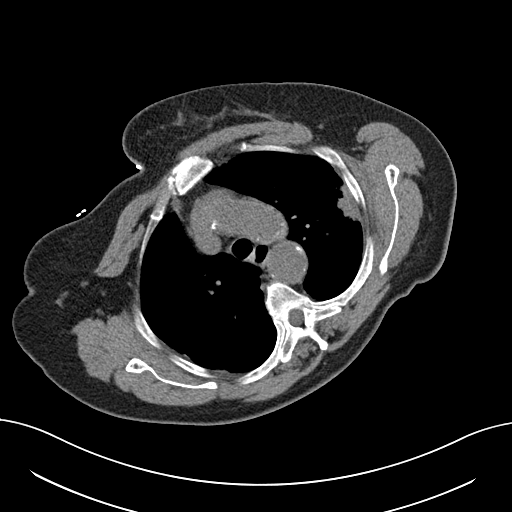
[im 16/31  soft-tissue]
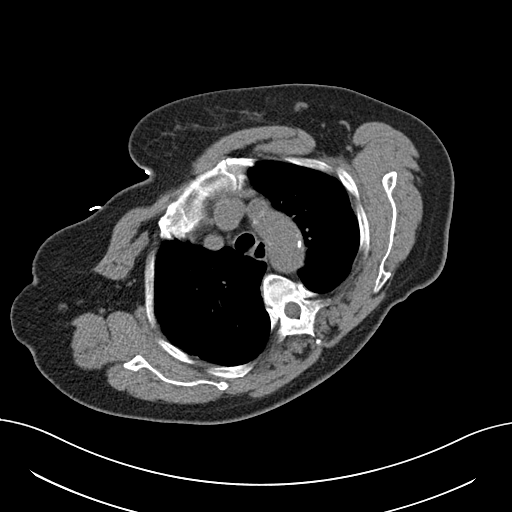
[im 19/31  soft-tissue]
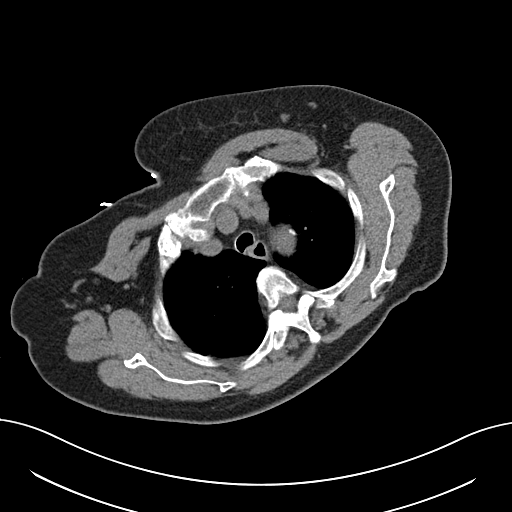

[13 of 32 positions shown; findings below may reference images not displayed]

EXAM:
CT-guided biopsy left upper lobe lobe pulmonary nodule

MEDICATIONS:
None.

ANESTHESIA/SEDATION:
Fentanyl 50 mcg IV; Versed 1 mg IV

Moderate Sedation Time:  14 minutes

The patient was continuously monitored during the procedure by the
interventional radiology nurse under my direct supervision.

FLUOROSCOPY TIME:  None.

COMPLICATIONS:
None immediate.

Estimated blood loss:  0

PROCEDURE:
Informed written consent was obtained from the patient after a
thorough discussion of the procedural risks, benefits and
alternatives. All questions were addressed. Maximal Sterile Barrier
Technique was utilized including caps, mask, sterile gowns, sterile
gloves, sterile drape, hand hygiene and skin antiseptic. A timeout
was performed prior to the initiation of the procedure.

A planning axial CT scan was performed. The nodule in the periphery
of the left upper lobe was successfully identified. A suitable skin
entry site was selected and marked. The region was then sterilely
prepped and draped in standard fashion using Betadine skin prep.
Local anesthesia was attained by infiltration with 1% lidocaine. A
small dermatotomy was made. Under intermittent CT fluoroscopic
guidance, a 17 gauge trocar needle was advanced into the lung and
positioned at the margin of the nodule.

Multiple 18 gauge core biopsies were then coaxially obtained using
the Bio[employer] automated biopsy device. Biopsy specimens were placed
in formalin and delivered to pathology for further analysis. The
biopsy device and introducer needle were removed. Post biopsy axial
CT imaging demonstrates no evidence of immediate complication. There
is no pneumothorax. Mild perilesional alveolar hemorrhage is not
unexpected. The patient tolerated the procedure well.
IMPRESSION: Technically successful CT-guided biopsy left upper lobe pulmonary
nodule.

## 2023-04-18 ENCOUNTER — Other Ambulatory Visit: Payer: Self-pay | Admitting: Family Medicine

## 2023-04-18 DIAGNOSIS — M5416 Radiculopathy, lumbar region: Secondary | ICD-10-CM

## 2023-05-05 ENCOUNTER — Other Ambulatory Visit: Payer: Medicare Other

## 2023-05-09 ENCOUNTER — Ambulatory Visit
Admission: RE | Admit: 2023-05-09 | Discharge: 2023-05-09 | Disposition: A | Discharge status: Home / Self Care | Payer: 59 | Source: Ambulatory Visit | Attending: Family Medicine | Admitting: Family Medicine

## 2023-05-09 DIAGNOSIS — M5416 Radiculopathy, lumbar region: Secondary | ICD-10-CM

## 2023-07-25 ENCOUNTER — Ambulatory Visit
Admission: RE | Admit: 2023-07-25 | Discharge: 2023-07-25 | Disposition: A | Discharge status: Home / Self Care | Payer: 59 | Source: Ambulatory Visit | Attending: Physician Assistant | Admitting: Physician Assistant

## 2023-07-25 ENCOUNTER — Other Ambulatory Visit: Payer: Self-pay | Admitting: Physician Assistant

## 2023-07-25 DIAGNOSIS — R1030 Lower abdominal pain, unspecified: Secondary | ICD-10-CM | POA: Insufficient documentation

## 2023-07-25 DIAGNOSIS — R109 Unspecified abdominal pain: Secondary | ICD-10-CM
